# Patient Record
Sex: Female | Born: 1970 | Race: Black or African American | Hispanic: No | Marital: Single | Smoking: Current every day smoker
Health system: Southern US, Community
[De-identification: ages and names within clinical notes are randomized; demographics above are authoritative.]

## PROBLEM LIST (undated history)

## (undated) DIAGNOSIS — K219 Gastro-esophageal reflux disease without esophagitis: Secondary | ICD-10-CM

## (undated) DIAGNOSIS — I10 Essential (primary) hypertension: Secondary | ICD-10-CM

## (undated) DIAGNOSIS — R0602 Shortness of breath: Secondary | ICD-10-CM

## (undated) DIAGNOSIS — J40 Bronchitis, not specified as acute or chronic: Secondary | ICD-10-CM

## (undated) HISTORY — PX: TUBAL LIGATION: SHX77

## (undated) HISTORY — PX: MENISCUS REPAIR: SHX5179

## (undated) HISTORY — PX: INDUCED ABORTION: SHX677

## (undated) HISTORY — PX: ABLATION: SHX5711

## (undated) HISTORY — PX: DILATION AND CURETTAGE OF UTERUS: SHX78

---

## 2001-11-21 ENCOUNTER — Encounter: Payer: Self-pay | Admitting: *Deleted

## 2001-11-21 ENCOUNTER — Emergency Department (HOSPITAL_COMMUNITY): Admission: EM | Admit: 2001-11-21 | Discharge: 2001-11-21 | Payer: Self-pay | Admitting: *Deleted

## 2002-04-17 ENCOUNTER — Emergency Department (HOSPITAL_COMMUNITY): Admission: EM | Admit: 2002-04-17 | Discharge: 2002-04-17 | Payer: Self-pay | Admitting: *Deleted

## 2003-12-08 ENCOUNTER — Emergency Department (HOSPITAL_COMMUNITY): Admission: EM | Admit: 2003-12-08 | Discharge: 2003-12-08 | Payer: Self-pay | Admitting: Emergency Medicine

## 2004-08-09 ENCOUNTER — Emergency Department (HOSPITAL_COMMUNITY): Admission: EM | Admit: 2004-08-09 | Discharge: 2004-08-09 | Payer: Self-pay | Admitting: Emergency Medicine

## 2004-08-12 ENCOUNTER — Emergency Department (HOSPITAL_COMMUNITY): Admission: EM | Admit: 2004-08-12 | Discharge: 2004-08-12 | Payer: Self-pay | Admitting: Emergency Medicine

## 2004-08-13 ENCOUNTER — Emergency Department (HOSPITAL_COMMUNITY): Admission: EM | Admit: 2004-08-13 | Discharge: 2004-08-13 | Payer: Self-pay | Admitting: Emergency Medicine

## 2004-12-09 ENCOUNTER — Emergency Department (HOSPITAL_COMMUNITY): Admission: EM | Admit: 2004-12-09 | Discharge: 2004-12-09 | Payer: Self-pay | Admitting: Emergency Medicine

## 2004-12-12 ENCOUNTER — Emergency Department (HOSPITAL_COMMUNITY): Admission: EM | Admit: 2004-12-12 | Discharge: 2004-12-12 | Payer: Self-pay | Admitting: Emergency Medicine

## 2004-12-14 ENCOUNTER — Emergency Department (HOSPITAL_COMMUNITY): Admission: EM | Admit: 2004-12-14 | Discharge: 2004-12-14 | Payer: Self-pay | Admitting: Emergency Medicine

## 2005-08-01 ENCOUNTER — Emergency Department (HOSPITAL_COMMUNITY): Admission: EM | Admit: 2005-08-01 | Discharge: 2005-08-01 | Payer: Self-pay | Admitting: Emergency Medicine

## 2006-11-03 ENCOUNTER — Emergency Department (HOSPITAL_COMMUNITY): Admission: EM | Admit: 2006-11-03 | Discharge: 2006-11-03 | Payer: Self-pay | Admitting: *Deleted

## 2006-12-18 ENCOUNTER — Emergency Department (HOSPITAL_COMMUNITY): Admission: EM | Admit: 2006-12-18 | Discharge: 2006-12-18 | Payer: Self-pay | Admitting: Emergency Medicine

## 2007-11-19 ENCOUNTER — Emergency Department (HOSPITAL_COMMUNITY): Admission: EM | Admit: 2007-11-19 | Discharge: 2007-11-19 | Payer: Self-pay | Admitting: Emergency Medicine

## 2008-04-04 ENCOUNTER — Emergency Department (HOSPITAL_COMMUNITY): Admission: EM | Admit: 2008-04-04 | Discharge: 2008-04-04 | Payer: Self-pay | Admitting: Emergency Medicine

## 2009-01-02 ENCOUNTER — Emergency Department (HOSPITAL_COMMUNITY): Admission: EM | Admit: 2009-01-02 | Discharge: 2009-01-02 | Payer: Self-pay | Admitting: Emergency Medicine

## 2010-03-21 ENCOUNTER — Emergency Department (HOSPITAL_COMMUNITY)
Admission: EM | Admit: 2010-03-21 | Discharge: 2010-03-21 | Payer: Self-pay | Source: Home / Self Care | Admitting: Emergency Medicine

## 2010-05-30 LAB — URINALYSIS, ROUTINE W REFLEX MICROSCOPIC
Bilirubin Urine: NEGATIVE
Glucose, UA: NEGATIVE mg/dL
Protein, ur: NEGATIVE mg/dL
Urobilinogen, UA: 2 mg/dL — ABNORMAL HIGH (ref 0.0–1.0)

## 2010-05-30 LAB — CBC
HCT: 37.7 % (ref 36.0–46.0)
MCV: 85.7 fL (ref 78.0–100.0)
Platelets: 227 10*3/uL (ref 150–400)
RBC: 4.39 MIL/uL (ref 3.87–5.11)
WBC: 5.5 10*3/uL (ref 4.0–10.5)

## 2010-05-30 LAB — COMPREHENSIVE METABOLIC PANEL
AST: 15 U/L (ref 0–37)
Albumin: 3.6 g/dL (ref 3.5–5.2)
Alkaline Phosphatase: 63 U/L (ref 39–117)
CO2: 26 mEq/L (ref 19–32)
Chloride: 108 mEq/L (ref 96–112)
GFR calc Af Amer: >60 mL/min (ref >60)
GFR calc non Af Amer: >60 mL/min (ref >60)
Potassium: 3.8 mEq/L (ref 3.5–5.1)
Total Bilirubin: 0.5 mg/dL (ref 0.3–1.2)

## 2010-05-30 LAB — DIFFERENTIAL
Basophils Absolute: 0 10*3/uL (ref 0.0–0.1)
Basophils Relative: 1 % (ref 0–1)
Eosinophils Absolute: 0.3 10*3/uL (ref 0.0–0.7)
Eosinophils Relative: 6 % — ABNORMAL HIGH (ref 0–5)
Monocytes Absolute: 0.6 10*3/uL (ref 0.1–1.0)

## 2010-05-30 LAB — D-DIMER, QUANTITATIVE: D-Dimer, Quant: 0.36 ug/mL-FEU (ref 0.00–0.48)

## 2010-05-30 LAB — URINE MICROSCOPIC-ADD ON

## 2010-06-06 ENCOUNTER — Ambulatory Visit: Payer: Self-pay | Admitting: Gastroenterology

## 2010-06-12 LAB — POCT I-STAT, CHEM 8
BUN: 10 mg/dL (ref 6–23)
Chloride: 107 mEq/L (ref 96–112)
Creatinine, Ser: 1 mg/dL (ref 0.4–1.2)
Glucose, Bld: 99 mg/dL (ref 70–99)
HCT: 44 % (ref 36.0–46.0)
Potassium: 4.1 mEq/L (ref 3.5–5.1)

## 2010-07-04 ENCOUNTER — Ambulatory Visit: Payer: Self-pay | Admitting: Urgent Care

## 2010-07-13 NOTE — Consult Note (Signed)
Elizabeth Quinn, Elizabeth Quinn               ACCOUNT NO.:  [id number]   MEDICAL RECORD NO.:  [id number]          PATIENT TYPE:  EMS   LOCATION:  ED                            FACILITY:  [facility]   PHYSICIAN:  J. Darreld Mclean, M.D. DATE OF BIRTH:  1970/11/03   DATE OF CONSULTATION:  08/01/2005  DATE OF DISCHARGE:                                   CONSULTATION   REQUESTING PHYSICIAN:  Dr. Rosalia Hammers   Dr. Rosalia Hammers called me early this morning, stated that the patient had fallen  through a plate glass window.  She had significant lacerations of the  posterior left upper arm, triceps, and significant bleeding.  Patient had  laceration distal to the elbow that Dr. Rosalia Hammers had repaired.  She asked me to  come and assist with closure of the wounds.  Dr. Rosalia Hammers had already  anesthetized the wound with 1% Xylocaine with epinephrine.  The patient was  somewhat somnolent.  She had received fentanyl and Versed.  She had a  laceration on the posterior upper arm with large deep laceration.  Lacerations about 10 cm to 15 cm, deep, multiple layers.  The patient was  already positioned.  Arm was in place.  Using sterile drapes area was  draped.  Used Betadine then did a layered closure.  There was multiple  layers.  Additionally I used a 3-0 Vicryl deeper muscle layers, the fascial  layers, then a 2-0 chromic, then 2-0 plain, then skin staples.  This took  some time to do.  __________  repair.  Sterile dressing applied at the end  of the procedure.  Prescription given for Levaquin 500 antibiotics and  Vicodin ES.  Will see the patient in my office on Friday morning for wound  change and dressing changeout.   IMPRESSION:  Deep laceration by multiple layer closure left upper arm  through the triceps muscle tendon.   Precautions were given.  Patient information booklet given, antibiotics, as  stated.  Come back to the emergency room if any problem.  Will go talk to  the family now.           ______________________________  J.  Darreld Mclean, M.D.     JWK/MEDQ  D:  08/01/2005  T:  08/01/2005  Job:  [id number]

## 2010-10-06 ENCOUNTER — Emergency Department (HOSPITAL_COMMUNITY)
Admission: EM | Admit: 2010-10-06 | Discharge: 2010-10-06 | Disposition: A | Discharge status: Home / Self Care | Payer: Medicaid Other | Attending: Emergency Medicine | Admitting: Emergency Medicine

## 2010-10-06 DIAGNOSIS — R109 Unspecified abdominal pain: Secondary | ICD-10-CM | POA: Insufficient documentation

## 2010-10-06 HISTORY — DX: Gastro-esophageal reflux disease without esophagitis: K21.9

## 2010-10-06 LAB — URINALYSIS, ROUTINE W REFLEX MICROSCOPIC
Glucose, UA: NEGATIVE mg/dL
Ketones, ur: NEGATIVE mg/dL
Leukocytes, UA: NEGATIVE
Protein, ur: NEGATIVE mg/dL
Urobilinogen, UA: 1 mg/dL (ref 0.0–1.0)

## 2010-10-06 LAB — WET PREP, GENITAL
Trich, Wet Prep: NONE SEEN
Yeast Wet Prep HPF POC: NONE SEEN

## 2010-10-06 LAB — URINE MICROSCOPIC-ADD ON

## 2010-10-06 MED ORDER — NAPROXEN 500 MG PO TABS: 500.0000 mg | ORAL_TABLET | Freq: Two times a day (BID) | ORAL | Status: DC

## 2010-10-06 MED ORDER — KETOROLAC TROMETHAMINE 60 MG/2ML IM SOLN
60.0000 mg | Freq: Once | INTRAMUSCULAR | Status: AC
  Administered 2010-10-06: 60 mg via INTRAMUSCULAR
  Filled 2010-10-06 (×2): qty 2

## 2010-10-06 NOTE — ED Notes (Signed)
Pt reports started menstrual period last Saturday and usually last 7 days.  PT says by now period is usually very light but is still very heavy.  Pt c/o abd cramps and headache.  Denies any other symptoms.  Says has had tubal ligation.

## 2010-10-06 NOTE — ED Provider Notes (Signed)
History     CSN: [id number] Arrival date & time: 10/06/2010  3:57 PM  Chief Complaint  Patient presents with  . Abdominal Pain   HPI Comments: Patient is a 40 year old female who presents with a complaint of abdominal cramping and heavy vaginal bleeding. She states that she's had vaginal bleeding going on approximately 7 days. She states this is longer than she usually bleeds. She is very regular cycle and she has not missed any periods. Symptoms are constant, mild to moderate, crampy in nature, not associated with fever, chills, nausea, vomiting, dysuria, diarrhea. She has had no medications prior to arrival.  Patient is a 40 y.o. female presenting with abdominal pain. The history is provided by the patient.  Abdominal Pain The primary symptoms of the illness include abdominal pain.    Past Medical History  Diagnosis Date  . Acid reflux     Past Surgical History  Procedure Date  . Tubal ligation   . Induced abortion   . Dilation and curettage of uterus     History reviewed. No pertinent family history.  History  Substance Use Topics  . Smoking status: Current Everyday Smoker  . Smokeless tobacco: Not on file  . Alcohol Use: Yes    OB History    Grav Para Term Preterm Abortions TAB SAB Ect Mult Living   5         3      Review of Systems  Gastrointestinal: Positive for abdominal pain.  All other systems reviewed and are negative.    Physical Exam  BP 124/92  Pulse 79  Temp(Src) 98.1 F (36.7 C) (Oral)  Resp 17  Ht 5\' 9"  (1.753 m)  Wt 145 lb (65.772 kg)  BMI 21.41 kg/m2  SpO2 100%  LMP 09/29/2010  Physical Exam  Nursing note and vitals reviewed. Constitutional: She appears well-developed and well-nourished. No distress.  HENT:  Head: Normocephalic and atraumatic.  Mouth/Throat: Oropharynx is clear and moist. No oropharyngeal exudate.  Eyes: Conjunctivae and EOM are normal. Pupils are equal, round, and reactive to light. Right eye exhibits no discharge.  Left eye exhibits no discharge. No scleral icterus.  Neck: Normal range of motion. Neck supple. No JVD present. No thyromegaly present.  Cardiovascular: Normal rate, regular rhythm, normal heart sounds and intact distal pulses.  Exam reveals no gallop and no friction rub.   No murmur heard. Pulmonary/Chest: Effort normal and breath sounds normal. No respiratory distress. She has no wheezes. She has no rales.  Abdominal: Soft. Bowel sounds are normal. She exhibits no distension and no mass. There is tenderness (mild suprapubic and bilateral lower abdominal tenderness to palpation).       No upper abdominal or midabdominal tenderness to palpation, no peritoneal, normal bowel sounds.  Genitourinary:       Chaperone present for exam.  No adnexal or cervical motion tenderness. No vaginal discharge, mild vaginal bleeding, no adnexal masses.  Musculoskeletal: Normal range of motion. She exhibits no edema and no tenderness.  Lymphadenopathy:    She has no cervical adenopathy.  Neurological: She is alert. Coordination normal.  Skin: Skin is warm and dry. No rash noted. No erythema.  Psychiatric: She has a normal mood and affect. Her behavior is normal.    ED Course  Procedures  MDM Abnormal vaginal bleeding, history of sterilization procedure, will check for pregnancy despite this as patient states is sexually active. She reports that she was told that she needed to endometrial ablation about 5 years ago  but did not followup for this. Will refer back to her OB/GYN for further care. Intramuscular Toradol given in the emergency department   Results for orders placed during the hospital encounter of 10/06/10  WET PREP, GENITAL      Component Value Range   Yeast, Wet Prep NONE SEEN  NONE SEEN    Trich, Wet Prep NONE SEEN  NONE SEEN    Clue Cells, Wet Prep FEW (*) NONE SEEN    WBC, Wet Prep HPF POC FEW (*) NONE SEEN   URINALYSIS, ROUTINE W REFLEX MICROSCOPIC      Component Value Range   Color,  Urine YELLOW  YELLOW    Appearance CLEAR  CLEAR    Specific Gravity, Urine >1.030 (*) 1.005 - 1.030    pH 5.5  5.0 - 8.0    Glucose, UA NEGATIVE  NEGATIVE (mg/dL)   Hgb urine dipstick TRACE (*) NEGATIVE    Bilirubin Urine NEGATIVE  NEGATIVE    Ketones, ur NEGATIVE  NEGATIVE (mg/dL)   Protein, ur NEGATIVE  NEGATIVE (mg/dL)   Urobilinogen, UA 1.0  0.0 - 1.0 (mg/dL)   Nitrite NEGATIVE  NEGATIVE    Leukocytes, UA NEGATIVE  NEGATIVE   PREGNANCY, URINE      Component Value Range   Preg Test, Ur NEGATIVE    URINE MICROSCOPIC-ADD ON      Component Value Range   Squamous Epithelial / LPF RARE  RARE    WBC, UA 0-2  <3 (WBC/hpf)   RBC / HPF 0-2  <3 (RBC/hpf)   Patient's urinalysis reveals dehydration but no signs of infection and pregnancy is negative. We'll discharge her with followup with OB/GYN    Vida Roller, MD 10/06/10 1701

## 2010-10-06 NOTE — ED Notes (Signed)
Reports started menstrual cycle 7 days ago; reports that usually bleeding is light by this point in her cycle, but c/o heavy bleeding now with lower abd cramping.

## 2010-10-24 ENCOUNTER — Emergency Department (HOSPITAL_COMMUNITY)
Admission: EM | Admit: 2010-10-24 | Discharge: 2010-10-25 | Disposition: A | Discharge status: Home / Self Care | Payer: Medicaid Other | Attending: Emergency Medicine | Admitting: Emergency Medicine

## 2010-10-24 ENCOUNTER — Encounter (HOSPITAL_COMMUNITY): Payer: Self-pay | Admitting: *Deleted

## 2010-10-24 DIAGNOSIS — F17200 Nicotine dependence, unspecified, uncomplicated: Secondary | ICD-10-CM | POA: Insufficient documentation

## 2010-10-24 DIAGNOSIS — N12 Tubulo-interstitial nephritis, not specified as acute or chronic: Secondary | ICD-10-CM | POA: Insufficient documentation

## 2010-10-24 LAB — CBC
MCH: 29.5 pg (ref 26.0–34.0)
MCV: 84.3 fL (ref 78.0–100.0)
Platelets: 223 10*3/uL (ref 150–400)
RDW: 13.2 % (ref 11.5–15.5)
WBC: 14.5 10*3/uL — ABNORMAL HIGH (ref 4.0–10.5)

## 2010-10-24 LAB — URINALYSIS, ROUTINE W REFLEX MICROSCOPIC
Bilirubin Urine: NEGATIVE
Nitrite: POSITIVE — AB
Specific Gravity, Urine: 1.025 (ref 1.005–1.030)
pH: 6 (ref 5.0–8.0)

## 2010-10-24 LAB — URINE MICROSCOPIC-ADD ON

## 2010-10-24 LAB — BASIC METABOLIC PANEL
Calcium: 9.2 mg/dL (ref 8.4–10.5)
Creatinine, Ser: 0.79 mg/dL (ref 0.50–1.10)
GFR calc Af Amer: >60 mL/min (ref >60)
GFR calc non Af Amer: >60 mL/min (ref >60)

## 2010-10-24 MED ORDER — HYDROMORPHONE HCL 1 MG/ML IJ SOLN
1.0000 mg | Freq: Once | INTRAMUSCULAR | Status: AC
  Administered 2010-10-24: 1 mg via INTRAVENOUS
  Filled 2010-10-24: qty 1

## 2010-10-24 MED ORDER — SODIUM CHLORIDE 0.9 % IV BOLUS (SEPSIS): 250.0000 mL | Freq: Once | INTRAVENOUS | Status: DC

## 2010-10-24 MED ORDER — DEXTROSE 5 % IV SOLN
1.0000 g | Freq: Once | INTRAVENOUS | Status: AC
  Administered 2010-10-24: 1 g via INTRAVENOUS
  Filled 2010-10-24: qty 1

## 2010-10-24 MED ORDER — SODIUM CHLORIDE 0.9 % IV SOLN: INTRAVENOUS | Status: DC

## 2010-10-24 MED ORDER — ONDANSETRON HCL 4 MG/2ML IJ SOLN
4.0000 mg | Freq: Once | INTRAMUSCULAR | Status: AC
  Administered 2010-10-24: 4 mg via INTRAVENOUS
  Filled 2010-10-24: qty 2

## 2010-10-24 NOTE — ED Provider Notes (Signed)
History     CSN: [id number] Arrival date & time: 10/24/2010  8:58 PM Scribed for Shelda Jakes, MD, the patient was seen in room [facility]. This chart was scribed by Katha Cabal. This patient's care was started at 9:53PM.     Chief Complaint  Patient presents with  . Back Pain  . Abdominal Pain  . Chills   HPI  Elizabeth Quinn is a 40 y.o. female who presents to the Emergency Department complaining of RUQ, RLQ, LUQ, LLQ abdominal pain that began 18 days ago and has been gradually getting worse.  Associated sx include bilateral lower extremity pain and cold perception.  Denies N/V/D, chest pain, rash, and bowel incontinence.  Pt was previously seen in ED 10/06/10 and was Dx with menstral cramps and given a referral to Dr. Emelda Fear.  Pt saw Dr. Emelda Fear 2 days ago for pain and upon evaluation causee of pain not identified.   Pt has an appointment with Dr. Emelda Fear for a Endometrial Abalition.  Pt had similar sx 5 years ago and began again after last period.  LMP 10/06/10   PAST MEDICAL HISTORY:  Past Medical History  Diagnosis Date  . Acid reflux     PAST SURGICAL HISTORY:  Past Surgical History  Procedure Date  . Tubal ligation   . Induced abortion   . Dilation and curettage of uterus     MEDICATIONS:  Previous Medications   NAPROXEN (NAPROSYN) 500 MG TABLET    Take 1 tablet (500 mg total) by mouth 2 (two) times daily.     ALLERGIES:  Allergies as of 10/24/2010  . (No Known Allergies)     FAMILY HISTORY:  History reviewed. No pertinent family history.   SOCIAL HISTORY: History   Social History  . Marital Status: Single    Spouse Name: N/A    Number of Children: N/A  . Years of Education: N/A   Social History Main Topics  . Smoking status: Current Everyday Smoker  . Smokeless tobacco: None  . Alcohol Use: Yes  . Drug Use: No  . Sexually Active: Yes    Birth Control/ Protection: Condom   Other Topics Concern  . None   Social History Narrative  .  None     Review of Systems 10 Systems reviewed and are negative for acute change except as noted in the HPI.  Physical Exam  BP 122/79  Pulse 108  Temp(Src) 100.5 F (38.1 C) (Oral)  Resp 20  Ht 5\' 9"  (1.753 m)  Wt 145 lb (65.772 kg)  BMI 21.41 kg/m2  SpO2 99%  LMP 09/29/2010  Physical Exam  Nursing note and vitals reviewed. Constitutional: She is oriented to person, place, and time. She appears well-developed and well-nourished.  HENT:  Head: Normocephalic and atraumatic.  Mouth/Throat: Oropharynx is clear and moist.  Eyes: EOM are normal. Pupils are equal, round, and reactive to light.  Neck: Normal range of motion. Neck supple.  Cardiovascular: Normal rate, regular rhythm and normal heart sounds.  Exam reveals no gallop and no friction rub.   No murmur heard. Pulmonary/Chest: Effort normal and breath sounds normal. She has no wheezes.  Abdominal: Soft. Bowel sounds are normal. There is no tenderness. There is no rebound and no guarding.  Musculoskeletal: Normal range of motion. She exhibits no edema.       No lower back tenderness.    Neurological: She is alert and oriented to person, place, and time. No cranial nerve deficit or sensory deficit.  Skin: Skin is warm and dry.  Psychiatric: She has a normal mood and affect. Her behavior is normal.    ED Course  Procedures  OTHER DATA REVIEWED: Nursing notes, vital signs, and past medical records reviewed.    DIAGNOSTIC STUDIES: Oxygen Saturation is 99% on room air, nromal by my interpretation.      LABS / RADIOLOGY:  Results for orders placed during the hospital encounter of 10/24/10  URINALYSIS, ROUTINE W REFLEX MICROSCOPIC      Component Value Range   Color, Urine ORANGE (*) YELLOW    Appearance CLOUDY (*) CLEAR    Specific Gravity, Urine 1.025  1.005 - 1.030    pH 6.0  5.0 - 8.0    Glucose, UA NEGATIVE  NEGATIVE (mg/dL)   Hgb urine dipstick MODERATE (*) NEGATIVE    Bilirubin Urine NEGATIVE  NEGATIVE     Ketones, ur NEGATIVE  NEGATIVE (mg/dL)   Protein, ur 30 (*) NEGATIVE (mg/dL)   Urobilinogen, UA 2.0 (*) 0.0 - 1.0 (mg/dL)   Nitrite POSITIVE (*) NEGATIVE    Leukocytes, UA MODERATE (*) NEGATIVE   PREGNANCY, URINE      Component Value Range   Preg Test, Ur NEGATIVE    URINE MICROSCOPIC-ADD ON      Component Value Range   Squamous Epithelial / LPF RARE  RARE    WBC, UA 21-50  <3 (WBC/hpf)   RBC / HPF 11-20  <3 (RBC/hpf)   Bacteria, UA MANY (*) RARE   CBC      Component Value Range   WBC 14.5 (*) 4.0 - 10.5 (K/uL)   RBC 4.27  3.87 - 5.11 (MIL/uL)   Hemoglobin 12.6  12.0 - 15.0 (g/dL)   HCT 04.5  40.9 - 81.1 (%)   MCV 84.3  78.0 - 100.0 (fL)   MCH 29.5  26.0 - 34.0 (pg)   MCHC 35.0  30.0 - 36.0 (g/dL)   RDW 91.4  78.2 - 95.6 (%)   Platelets 223  150 - 400 (K/uL)  BASIC METABOLIC PANEL      Component Value Range   Sodium 133 (*) 135 - 145 (mEq/L)   Potassium 3.5  3.5 - 5.1 (mEq/L)   Chloride 100  96 - 112 (mEq/L)   CO2 23  19 - 32 (mEq/L)   Glucose, Bld 106 (*) 70 - 99 (mg/dL)   BUN 6  6 - 23 (mg/dL)   Creatinine, Ser 2.13  0.50 - 1.10 (mg/dL)   Calcium 9.2  8.4 - 08.6 (mg/dL)   GFR calc non Af Amer >60  >60 (mL/min)   GFR calc Af Amer >60  >60 (mL/min)     No results found.     ED COURSE / COORDINATION OF CARE: 9:57 PM    PE complete.Will order antiemitc and CT Abdomen.  12:15 AM Urine indicated infection. Patient was given IV fluids, pain control, and IV ROCEPHIN.  Pt states she feels much better now.  Discussed that she didn't have a urinary infection 2 weeks ago.  Patient is to return if sx worsen or no improvement noted with treatment.  Plan to discharge patient.   Patient agrees with plan.     Orders Placed This Encounter  Procedures  . Urine culture  . Urinalysis with microscopic  . Pregnancy, urine  . Urine microscopic-add on  . CBC  . Basic metabolic panel    MDM:   CW UTI PYELONEPHRITIS IMPROVED IN ED WITH IV ROCEPHIN. SUSPECT THIS A NEW PROBLEM  AND  NOT ONGOING FOR 2 WEEKS. IF NOT BETTER IN 2 DAYS AND URINE CLEARS WILL NEED CT ABD PELVIS TO FURTHER EVAL.     IMPRESSION: Diagnoses that have been ruled out:  Diagnoses that are still under consideration:  Final diagnoses:  Pyelonephritis, unspecified    PLAN:  Home Advised to return for worsening or additional problems such as abdominal or chest pain The patient is to return the emergency department if there is any worsening of symptoms. I have reviewed the discharge instructions with the patient.     CONDITION ON DISCHARGE: Good   MEDICATIONS GIVEN IN THE E.D. Scheduled Meds:    . HYDROmorphone  1 mg Intravenous Once  . ondansetron  4 mg Intravenous Once  . ondansetron  4 mg Intravenous Once  . sodium chloride  250 mL Intravenous Once   Continuous Infusions:    . sodium chloride    . cefTRIAXone (ROCEPHIN) IVPB 1 gram/50 mL D5W 1 g (10/24/10 2244)      DISCHARGE MEDICATIONS: New Prescriptions   CEPHALEXIN (KEFLEX) 500 MG CAPSULE    Take 1 capsule (500 mg total) by mouth 4 (four) times daily.   HYDROCODONE-ACETAMINOPHEN (NORCO) 5-325 MG PER TABLET    Take 1-2 tablets by mouth every 4 (four) hours as needed for pain.   PROMETHAZINE (PHENERGAN) 25 MG TABLET    Take 1 tablet (25 mg total) by mouth every 6 (six) hours as needed for nausea.     I personally performed the services described in this documentation, which was scribed in my presence. The recorded information has been reviewed and considered. Shelda Jakes, MD        Shelda Jakes, MD 10/25/10 [phone]

## 2010-10-24 NOTE — ED Notes (Signed)
Pt c/o abd pain and lower back pain x18 days; pt states she has been having chills as well

## 2010-10-25 MED ORDER — PROMETHAZINE HCL 25 MG PO TABS: 25.0000 mg | ORAL_TABLET | Freq: Four times a day (QID) | ORAL | Status: AC | PRN

## 2010-10-25 MED ORDER — HYDROCODONE-ACETAMINOPHEN 5-325 MG PO TABS: 1.0000 | ORAL_TABLET | ORAL | Status: AC | PRN

## 2010-10-25 MED ORDER — CEPHALEXIN 500 MG PO CAPS: 500.0000 mg | ORAL_CAPSULE | Freq: Four times a day (QID) | ORAL | Status: AC

## 2010-10-25 MED ORDER — ONDANSETRON HCL 4 MG/2ML IJ SOLN
4.0000 mg | Freq: Once | INTRAMUSCULAR | Status: AC
  Administered 2010-10-25: 4 mg via INTRAVENOUS
  Filled 2010-10-25: qty 2

## 2010-10-27 LAB — URINE CULTURE

## 2010-10-28 NOTE — ED Notes (Signed)
+   urine culture. Treated with Keflex, sensitive to same per protocol MD. 

## 2010-11-22 ENCOUNTER — Encounter (HOSPITAL_COMMUNITY): Payer: Self-pay

## 2010-11-22 DIAGNOSIS — T7411XA Adult physical abuse, confirmed, initial encounter: Secondary | ICD-10-CM | POA: Insufficient documentation

## 2010-11-22 DIAGNOSIS — S01501A Unspecified open wound of lip, initial encounter: Secondary | ICD-10-CM | POA: Insufficient documentation

## 2010-11-22 DIAGNOSIS — IMO0002 Reserved for concepts with insufficient information to code with codable children: Secondary | ICD-10-CM | POA: Insufficient documentation

## 2010-11-22 DIAGNOSIS — F17200 Nicotine dependence, unspecified, uncomplicated: Secondary | ICD-10-CM | POA: Insufficient documentation
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
## 2010-11-23 ENCOUNTER — Emergency Department (HOSPITAL_COMMUNITY)
Admission: EM | Admit: 2010-11-23 | Discharge: 2010-11-23 | Disposition: A | Discharge status: Home / Self Care | Payer: Medicaid Other | Attending: Emergency Medicine | Admitting: Emergency Medicine

## 2010-11-23 DIAGNOSIS — S01511A Laceration without foreign body of lip, initial encounter: Secondary | ICD-10-CM

## 2010-11-23 DIAGNOSIS — T07XXXA Unspecified multiple injuries, initial encounter: Secondary | ICD-10-CM

## 2010-11-23 MED ORDER — HYDROCODONE-ACETAMINOPHEN 5-500 MG PO TABS: 1.0000 | ORAL_TABLET | Freq: Four times a day (QID) | ORAL | Status: AC | PRN

## 2010-11-23 MED ORDER — BACITRACIN ZINC 500 UNIT/GM EX OINT
TOPICAL_OINTMENT | Freq: Once | CUTANEOUS | Status: AC
  Administered 2010-11-23: 02:00:00 via TOPICAL
  Filled 2010-11-23: qty 0.9

## 2010-11-23 NOTE — ED Notes (Signed)
Abrasion noted to mouth and left  knees and left shoulder
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
## 2010-11-23 NOTE — ED Notes (Signed)
Pt self ambulated out with a steady gait stating no needs 

## 2010-11-28 ENCOUNTER — Encounter (HOSPITAL_COMMUNITY)
Admission: RE | Admit: 2010-11-28 | Discharge: 2010-11-28 | Disposition: A | Discharge status: Home / Self Care | Payer: Medicaid Other | Source: Ambulatory Visit | Attending: Obstetrics & Gynecology | Admitting: Obstetrics & Gynecology

## 2010-11-28 ENCOUNTER — Other Ambulatory Visit: Payer: Self-pay | Admitting: Obstetrics & Gynecology

## 2010-11-28 ENCOUNTER — Encounter (HOSPITAL_COMMUNITY): Payer: Self-pay

## 2010-11-28 HISTORY — DX: Shortness of breath: R06.02

## 2010-11-28 LAB — CBC
Hemoglobin: 13.2 g/dL (ref 12.0–15.0)
MCH: 28.5 pg (ref 26.0–34.0)
MCHC: 34.2 g/dL (ref 30.0–36.0)
Platelets: 253 10*3/uL (ref 150–400)
RBC: 4.63 MIL/uL (ref 3.87–5.11)

## 2010-11-28 LAB — COMPREHENSIVE METABOLIC PANEL
ALT: 8 U/L (ref 0–35)
AST: 11 U/L (ref 0–37)
Albumin: 3.7 g/dL (ref 3.5–5.2)
Alkaline Phosphatase: 62 U/L (ref 39–117)
Calcium: 9.8 mg/dL (ref 8.4–10.5)
Potassium: 4 mEq/L (ref 3.5–5.1)
Sodium: 138 mEq/L (ref 135–145)
Total Protein: 7.2 g/dL (ref 6.0–8.3)

## 2010-11-28 LAB — SURGICAL PCR SCREEN
MRSA, PCR: NEGATIVE
Staphylococcus aureus: NEGATIVE

## 2010-11-28 LAB — URINALYSIS, ROUTINE W REFLEX MICROSCOPIC
Bilirubin Urine: NEGATIVE
Glucose, UA: NEGATIVE mg/dL
Hgb urine dipstick: NEGATIVE
Specific Gravity, Urine: <1.005 — ABNORMAL LOW (ref 1.005–1.030)
Urobilinogen, UA: 0.2 mg/dL (ref 0.0–1.0)
pH: 5.5 (ref 5.0–8.0)

## 2010-11-28 NOTE — Patient Instructions (Addendum)
20 WREATHA STURGEON  11/28/2010   Your procedure is scheduled on:  12/05/2010  Report to [facility] at  900  AM.  Call this number if you have problems the morning of surgery: [phone]   Remember:   Do not eat food:After Midnight.  Do not drink clear liquids: After Midnight.  Take these medicines the morning of surgery with A SIP OF WATER: none   Do not wear jewelry, make-up or nail polish.  Do not wear lotions, powders, or perfumes. You may wear deodorant.  Do not shave 48 hours prior to surgery.  Do not bring valuables to the hospital.  Contacts, dentures or bridgework may not be worn into surgery.  Leave suitcase in the car. After surgery it may be brought to your room.  For patients admitted to the hospital, checkout time is 11:00 AM the day of discharge.   Patients discharged the day of surgery will not be allowed to drive home.  Name and phone number of your driver: family  Special Instructions: CHG Shower Use Special Wash: 1/2 bottle night before surgery and 1/2 bottle morning of surgery.   Please read over the following fact sheets that you were given: Pain Booklet, MRSA Information, Surgical Site Infection Prevention, Anesthesia Post-op Instructions and Care and Recovery After Surgery PATIENT INSTRUCTIONS POST-ANESTHESIA  IMMEDIATELY FOLLOWING SURGERY:  Do not drive or operate machinery for the first twenty four hours after surgery.  Do not make any important decisions for twenty four hours after surgery or while taking narcotic pain medications or sedatives.  If you develop intractable nausea and vomiting or a severe headache please notify your doctor immediately.  FOLLOW-UP:  Please make an appointment with your surgeon as instructed. You do not need to follow up with anesthesia unless specifically instructed to do so.  WOUND CARE INSTRUCTIONS (if applicable):  Keep a dry clean dressing on the anesthesia/puncture wound site if there is drainage.  Once the wound has quit  draining you may leave it open to air.  Generally you should leave the bandage intact for twenty four hours unless there is drainage.  If the epidural site drains for more than 36-48 hours please call the anesthesia department.  QUESTIONS?:  Please feel free to call your physician or the hospital operator if you have any questions, and they will be happy to assist you.     [facility] Anesthesia Department [address] [place] [phone]

## 2010-12-05 ENCOUNTER — Encounter (HOSPITAL_COMMUNITY)
Admission: RE | Disposition: A | Discharge status: Home / Self Care | Payer: Self-pay | Source: Ambulatory Visit | Attending: Obstetrics & Gynecology

## 2010-12-05 ENCOUNTER — Encounter (HOSPITAL_COMMUNITY): Payer: Self-pay | Admitting: Anesthesiology

## 2010-12-05 ENCOUNTER — Encounter (HOSPITAL_COMMUNITY): Payer: Self-pay | Admitting: *Deleted

## 2010-12-05 ENCOUNTER — Ambulatory Visit (HOSPITAL_COMMUNITY)
Admission: RE | Admit: 2010-12-05 | Discharge: 2010-12-05 | Disposition: A | Discharge status: Home / Self Care | Payer: Medicaid Other | Source: Ambulatory Visit | Attending: Obstetrics & Gynecology | Admitting: Obstetrics & Gynecology

## 2010-12-05 ENCOUNTER — Ambulatory Visit (HOSPITAL_COMMUNITY): Payer: Medicaid Other | Admitting: Anesthesiology

## 2010-12-05 DIAGNOSIS — Z01812 Encounter for preprocedural laboratory examination: Secondary | ICD-10-CM | POA: Insufficient documentation

## 2010-12-05 DIAGNOSIS — N920 Excessive and frequent menstruation with regular cycle: Secondary | ICD-10-CM | POA: Insufficient documentation

## 2010-12-05 DIAGNOSIS — N946 Dysmenorrhea, unspecified: Secondary | ICD-10-CM | POA: Insufficient documentation

## 2010-12-05 DIAGNOSIS — Z9889 Other specified postprocedural states: Secondary | ICD-10-CM

## 2010-12-05 SURGERY — DILATATION & CURETTAGE/HYSTEROSCOPY WITH THERMACHOICE ABLATION
Anesthesia: General | Site: Uterus | Wound class: Clean Contaminated

## 2010-12-05 MED ORDER — LACTATED RINGERS IV SOLN
INTRAVENOUS | Status: DC
  Administered 2010-12-05: 11:00:00 via INTRAVENOUS

## 2010-12-05 MED ORDER — ONDANSETRON HCL 4 MG/2ML IJ SOLN
4.0000 mg | Freq: Once | INTRAMUSCULAR | Status: AC
  Administered 2010-12-05: 4 mg via INTRAVENOUS

## 2010-12-05 MED ORDER — FENTANYL CITRATE 0.05 MG/ML IJ SOLN
INTRAMUSCULAR | Status: DC | PRN
  Administered 2010-12-05 (×2): 50 ug via INTRAVENOUS

## 2010-12-05 MED ORDER — PROPOFOL 10 MG/ML IV EMUL
INTRAVENOUS | Status: AC
  Filled 2010-12-05: qty 20

## 2010-12-05 MED ORDER — MIDAZOLAM HCL 2 MG/2ML IJ SOLN
INTRAMUSCULAR | Status: AC
  Administered 2010-12-05: 2 mg via INTRAVENOUS
  Filled 2010-12-05: qty 2

## 2010-12-05 MED ORDER — ONDANSETRON HCL 8 MG PO TABS: 8.0000 mg | ORAL_TABLET | Freq: Three times a day (TID) | ORAL | Status: AC | PRN

## 2010-12-05 MED ORDER — MIDAZOLAM HCL 2 MG/2ML IJ SOLN
1.0000 mg | INTRAMUSCULAR | Status: DC | PRN
  Administered 2010-12-05 (×2): 2 mg via INTRAVENOUS

## 2010-12-05 MED ORDER — SODIUM CHLORIDE 0.9 % IR SOLN
Status: DC | PRN
  Administered 2010-12-05: 3000 mL

## 2010-12-05 MED ORDER — FENTANYL CITRATE 0.05 MG/ML IJ SOLN
25.0000 ug | INTRAMUSCULAR | Status: DC | PRN
  Administered 2010-12-05 (×2): 50 ug via INTRAVENOUS

## 2010-12-05 MED ORDER — LIDOCAINE HCL (PF) 1 % IJ SOLN
INTRAMUSCULAR | Status: AC
  Filled 2010-12-05: qty 5

## 2010-12-05 MED ORDER — PROPOFOL 10 MG/ML IV EMUL
INTRAVENOUS | Status: DC | PRN
  Administered 2010-12-05: 50 mg via INTRAVENOUS
  Administered 2010-12-05: 150 mg via INTRAVENOUS

## 2010-12-05 MED ORDER — CEFAZOLIN SODIUM 1-5 GM-% IV SOLN
1.0000 g | INTRAVENOUS | Status: AC
  Administered 2010-12-05: 1 g via INTRAVENOUS

## 2010-12-05 MED ORDER — ONDANSETRON HCL 4 MG/2ML IJ SOLN
INTRAMUSCULAR | Status: AC
  Administered 2010-12-05: 4 mg via INTRAVENOUS
  Filled 2010-12-05: qty 2

## 2010-12-05 MED ORDER — MIDAZOLAM HCL 2 MG/2ML IJ SOLN
INTRAMUSCULAR | Status: AC
  Filled 2010-12-05: qty 2

## 2010-12-05 MED ORDER — FENTANYL CITRATE 0.05 MG/ML IJ SOLN
INTRAMUSCULAR | Status: AC
  Administered 2010-12-05: 50 ug via INTRAVENOUS
  Filled 2010-12-05: qty 2

## 2010-12-05 MED ORDER — KETOROLAC TROMETHAMINE 30 MG/ML IJ SOLN
INTRAMUSCULAR | Status: AC
  Administered 2010-12-05: 30 mg via INTRAVENOUS
  Filled 2010-12-05: qty 1

## 2010-12-05 MED ORDER — HYDROCODONE-ACETAMINOPHEN 5-500 MG PO TABS: 1.0000 | ORAL_TABLET | Freq: Four times a day (QID) | ORAL | Status: AC | PRN

## 2010-12-05 MED ORDER — CEFAZOLIN SODIUM 1-5 GM-% IV SOLN
INTRAVENOUS | Status: AC
  Filled 2010-12-05: qty 50

## 2010-12-05 MED ORDER — KETOROLAC TROMETHAMINE 30 MG/ML IJ SOLN
30.0000 mg | Freq: Four times a day (QID) | INTRAMUSCULAR | Status: DC | PRN
  Administered 2010-12-05: 30 mg via INTRAVENOUS

## 2010-12-05 MED ORDER — SODIUM CHLORIDE 0.9 % IR SOLN
Status: DC | PRN
  Administered 2010-12-05: 1000 mL

## 2010-12-05 MED ORDER — LIDOCAINE HCL 1 % IJ SOLN
INTRAMUSCULAR | Status: DC | PRN
  Administered 2010-12-05: 20 mg via INTRADERMAL

## 2010-12-05 MED ORDER — KETOROLAC TROMETHAMINE 10 MG PO TABS: 10.0000 mg | ORAL_TABLET | Freq: Three times a day (TID) | ORAL | Status: AC | PRN

## 2010-12-05 MED ORDER — DEXTROSE 5 % IV SOLN
INTRAVENOUS | Status: DC | PRN
  Administered 2010-12-05: 500 mL via INTRAVENOUS

## 2010-12-05 MED ORDER — ONDANSETRON HCL 4 MG/2ML IJ SOLN: 4.0000 mg | Freq: Once | INTRAMUSCULAR | Status: DC | PRN

## 2010-12-05 SURGICAL SUPPLY — 32 items
BAG HAMPER (MISCELLANEOUS) ×2 IMPLANT
CATH THERMACHOICE III (CATHETERS) ×3 IMPLANT
CLOTH BEACON ORANGE TIMEOUT ST (SAFETY) ×2 IMPLANT
COVER LIGHT HANDLE STERIS (MISCELLANEOUS) ×4 IMPLANT
FORMALIN 10 PREFIL 120ML (MISCELLANEOUS) ×1 IMPLANT
GAUZE SPONGE 4X4 16PLY XRAY LF (GAUZE/BANDAGES/DRESSINGS) ×2 IMPLANT
GLOVE BIOGEL PI IND STRL 7.0 (GLOVE) IMPLANT
GLOVE BIOGEL PI IND STRL 8 (GLOVE) ×1 IMPLANT
GLOVE BIOGEL PI INDICATOR 7.0 (GLOVE) ×2
GLOVE BIOGEL PI INDICATOR 8 (GLOVE) ×1
GLOVE ECLIPSE 8.0 STRL XLNG CF (GLOVE) ×2 IMPLANT
GLOVE EXAM NITRILE MD LF STRL (GLOVE) ×2 IMPLANT
GLOVE OPTIFIT SS 6.5 STRL BRWN (GLOVE) ×1 IMPLANT
GLOVE SS BIOGEL STRL SZ 6.5 (GLOVE) IMPLANT
GLOVE SUPERSENSE BIOGEL SZ 6.5 (GLOVE) ×1
GOWN BRE IMP SLV AUR XL STRL (GOWN DISPOSABLE) ×2 IMPLANT
GOWN STRL REIN XL XLG (GOWN DISPOSABLE) ×2 IMPLANT
INST SET HYSTEROSCOPY (KITS) ×2 IMPLANT
IV NS IRRIG 3000ML ARTHROMATIC (IV SOLUTION) ×2 IMPLANT
KIT ROOM TURNOVER APOR (KITS) ×2 IMPLANT
MANIFOLD NEPTUNE II (INSTRUMENTS) ×2 IMPLANT
MANIFOLD NEPTUNE WASTE (CANNULA) ×2 IMPLANT
MARKER SKIN DUAL TIP RULER LAB (MISCELLANEOUS) ×1 IMPLANT
NS IRRIG 1000ML POUR BTL (IV SOLUTION) ×2 IMPLANT
PACK BASIC III (CUSTOM PROCEDURE TRAY) ×2
PACK SRG BSC III STRL LF ECLPS (CUSTOM PROCEDURE TRAY) ×1 IMPLANT
PAD ARMBOARD 7.5X6 YLW CONV (MISCELLANEOUS) ×2 IMPLANT
PAD TELFA 3X4 1S STER (GAUZE/BANDAGES/DRESSINGS) ×2 IMPLANT
SET IRRIG Y TYPE TUR BLADDER L (SET/KITS/TRAYS/PACK) ×2 IMPLANT
SHEET LAVH (DRAPES) ×3 IMPLANT
SOLUTION ANTI FOG 6CC (MISCELLANEOUS) ×1 IMPLANT
YANKAUER SUCT BULB TIP 10FT TU (MISCELLANEOUS) ×2 IMPLANT

## 2010-12-05 NOTE — Interval H&P Note (Signed)
History and Physical Interval Note:   12/05/2010   8:03 AM   Elizabeth Quinn  has presented today for surgery, with the diagnosis of menometorrhagia dysmenorrhea  The various methods of treatment have been discussed with the patient and family. After consideration of risks, benefits and other options for treatment, the patient has consented to  Procedure(s): DILATATION & CURETTAGE/HYSTEROSCOPY WITH THERMACHOICE ABLATION as a surgical intervention .  I have reviewed the patients' chart and labs.  Questions were answered to the patient's satisfaction.     Lazaro Arms  MD

## 2010-12-05 NOTE — Transfer of Care (Signed)
Immediate Anesthesia Transfer of Care Note  Patient: NOELY KUHNLE  Procedure(s) Performed:  DILATATION & CURETTAGE/HYSTEROSCOPY WITH THERMACHOICE ABLATION - Hysteroscopy, Endometrial Ablation with Thermachoice.  13ml instilled into balloon, 13ml taken out of balloon, temp 86C.  Patient Location: PACU  Anesthesia Type: General  Level of Consciousness: awake  Airway & Oxygen Therapy: Patient Spontanous Breathing and non-rebreather face mask  Post-op Assessment: Report given to PACU RN, Post -op Vital signs reviewed and stable and Patient moving all extremities  Post vital signs: Reviewed and stable  Complications: No apparent anesthesia complications

## 2010-12-05 NOTE — Anesthesia Postprocedure Evaluation (Signed)
Anesthesia Post Note  Patient: Elizabeth Quinn  Procedure(s) Performed:  DILATATION & CURETTAGE/HYSTEROSCOPY WITH THERMACHOICE ABLATION - Hysteroscopy, Endometrial Ablation with Thermachoice.  13ml instilled into balloon, 13ml taken out of balloon, temp 86C.  Anesthesia type: General  Patient location: PACU  Post pain: Pain level controlled  Post assessment: Post-op Vital signs reviewed, Patient's Cardiovascular Status Stable, Respiratory Function Stable, Patent Airway, No signs of Nausea or vomiting and Pain level controlled  Last Vitals:  Filed Vitals:   12/05/10 1212  BP: 133/75  Pulse:   Temp: 98.8 F (37.1 C)  Resp: 15    Post vital signs: Reviewed and stable  Level of consciousness: awake and alert   Complications: No apparent anesthesia complications

## 2010-12-05 NOTE — Anesthesia Procedure Notes (Addendum)
Procedure Name: LMA Insertion Date/Time: 12/05/2010 11:20 AM Performed by: Minerva Areola Pre-anesthesia Checklist: Patient identified, Patient being monitored, Emergency Drugs available, Timeout performed and Suction available Patient Re-evaluated:Patient Re-evaluated prior to inductionOxygen Delivery Method: Circle System Utilized Preoxygenation: Pre-oxygenation with 100% oxygen Intubation Type: IV induction Ventilation: Mask ventilation without difficulty LMA: LMA inserted LMA Size: 4.0 Number of attempts: 1 Placement Confirmation: positive ETCO2 and breath sounds checked- equal and bilateral

## 2010-12-05 NOTE — H&P (Signed)
Elizabeth Quinn is an 40 y.o. female. G 5 P 3 A 2 with LMP 11/03/2010 with many year history of menometrorrhagia and dysmenorrhea.  No pain otherwise or problems.  Sonogram normal.  Alternatives discussed and patient desires to proceed with endometrial ablation.      Past Medical History  Diagnosis Date  . Acid reflux   . Shortness of breath     Past Surgical History  Procedure Date  . Induced abortion   . Dilation and curettage of uterus   . Tubal ligation 17 yrs ago    Family History  Problem Relation Age of Onset  . Anesthesia problems Neg Hx   . Hypotension Neg Hx   . Malignant hyperthermia Neg Hx   . Pseudochol deficiency Neg Hx     Social History:  reports that she has been smoking Cigarettes.  She has a 21 pack-year smoking history. She does not have any smokeless tobacco history on file. She reports that she drinks alcohol. She reports that she does not use illicit drugs.  Allergies: No Known Allergies    ROS  Review of Systems  Constitutional: Negative for fever, chills, weight loss, malaise/fatigue and diaphoresis.  HENT: Negative for hearing loss, ear pain, nosebleeds, congestion, sore throat, neck pain, tinnitus and ear discharge.   Eyes: Negative for blurred vision, double vision, photophobia, pain, discharge and redness.  Respiratory: Negative for cough, hemoptysis, sputum production, shortness of breath, wheezing and stridor.   Cardiovascular: Negative for chest pain, palpitations, orthopnea, claudication, leg swelling and PND.  Gastrointestinal: Positive for abdominal pain with menses. Negative for heartburn, nausea, vomiting, diarrhea, constipation, blood in stool and melena.  Genitourinary: Negative for dysuria, urgency, frequency, hematuria and flank pain.  Musculoskeletal: Negative for myalgias, back pain, joint pain and falls.  Skin: Negative for itching and rash.  Neurological: Negative for dizziness, tingling, tremors, sensory change, speech change,  focal weakness, seizures, loss of consciousness, weakness and headaches.  Endo/Heme/Allergies: Negative for environmental allergies and polydipsia. Does not bruise/bleed easily.  Psychiatric/Behavioral: Negative for depression, suicidal ideas, hallucinations, memory loss and substance abuse. The patient is not nervous/anxious and does not have insomnia.      Physical Exam Physical Exam  Vitals reviewed. Constitutional: She is oriented to person, place, and time. She appears well-developed and well-nourished.  HENT:  Head: Normocephalic and atraumatic.  Right Ear: External ear normal.  Left Ear: External ear normal.  Nose: Nose normal.  Mouth/Throat: Oropharynx is clear and moist.  Eyes: Conjunctivae and EOM are normal. Pupils are equal, round, and reactive to light. Right eye exhibits no discharge. Left eye exhibits no discharge. No scleral icterus.  Neck: Normal range of motion. Neck supple. No tracheal deviation present. No thyromegaly present.  Cardiovascular: Normal rate, regular rhythm, normal heart sounds and intact distal pulses.  Exam reveals no gallop and no friction rub.   No murmur heard. Respiratory: Effort normal and breath sounds normal. No respiratory distress. She has no wheezes. She has no rales. She exhibits no tenderness.  GI: Soft. Bowel sounds are normal. She exhibits no distension and no mass. There is tenderness. There is no rebound and no guarding.  Genitourinary:   Vulva is normal without lesions Vagina is pink moist without discharge Cervix normal in appearance and pap is normal Uterus is normal size shape contour and is non-tender. Adnexa is negative with normal sized ovaries by sonogram  Musculoskeletal: Normal range of motion. She exhibits no edema and no tenderness.  Neurological: She is alert  and oriented to person, place, and time. She has normal reflexes. She displays normal reflexes. No cranial nerve deficit. She exhibits normal muscle tone. Coordination  normal.  Skin: Skin is warm and dry. No rash noted. No erythema. No pallor.  Psychiatric: She has a normal mood and affect. Her behavior is normal. Judgment and thought content normal.     Recent Results (from the past 336 hour(s))  URINALYSIS, ROUTINE W REFLEX MICROSCOPIC   Collection Time   11/28/10 10:33 AM      Component Value Range   Color, Urine YELLOW  YELLOW    Appearance CLEAR  CLEAR    Specific Gravity, Urine <1.005 (*) 1.005 - 1.030    pH 5.5  5.0 - 8.0    Glucose, UA NEGATIVE  NEGATIVE (mg/dL)   Hgb urine dipstick NEGATIVE  NEGATIVE    Bilirubin Urine NEGATIVE  NEGATIVE    Ketones, ur NEGATIVE  NEGATIVE (mg/dL)   Protein, ur NEGATIVE  NEGATIVE (mg/dL)   Urobilinogen, UA 0.2  0.0 - 1.0 (mg/dL)   Nitrite NEGATIVE  NEGATIVE    Leukocytes, UA NEGATIVE  NEGATIVE   SURGICAL PCR SCREEN   Collection Time   11/28/10 10:40 AM      Component Value Range   MRSA, PCR NEGATIVE  NEGATIVE    Staphylococcus aureus NEGATIVE  NEGATIVE   CBC   Collection Time   11/28/10 11:00 AM      Component Value Range   WBC 4.7  4.0 - 10.5 (K/uL)   RBC 4.63  3.87 - 5.11 (MIL/uL)   Hemoglobin 13.2  12.0 - 15.0 (g/dL)   HCT 62.9  52.8 - 41.3 (%)   MCV 83.4  78.0 - 100.0 (fL)   MCH 28.5  26.0 - 34.0 (pg)   MCHC 34.2  30.0 - 36.0 (g/dL)   RDW 24.4  01.0 - 27.2 (%)   Platelets 253  150 - 400 (K/uL)  COMPREHENSIVE METABOLIC PANEL   Collection Time   11/28/10 11:00 AM      Component Value Range   Sodium 138  135 - 145 (mEq/L)   Potassium 4.0  3.5 - 5.1 (mEq/L)   Chloride 104  96 - 112 (mEq/L)   CO2 27  19 - 32 (mEq/L)   Glucose, Bld 66 (*) 70 - 99 (mg/dL)   BUN 6  6 - 23 (mg/dL)   Creatinine, Ser 5.36  0.50 - 1.10 (mg/dL)   Calcium 9.8  8.4 - 64.4 (mg/dL)   Total Protein 7.2  6.0 - 8.3 (g/dL)   Albumin 3.7  3.5 - 5.2 (g/dL)   AST 11  0 - 37 (U/L)   ALT 8  0 - 35 (U/L)   Alkaline Phosphatase 62  39 - 117 (U/L)   Total Bilirubin 0.2 (*) 0.3 - 1.2 (mg/dL)   GFR calc non Af Amer 73 (*) >90  (mL/min)   GFR calc Af Amer 85 (*) >90 (mL/min)  HCG, QUANTITATIVE, PREGNANCY   Collection Time   11/28/10 11:00 AM      Component Value Range   hCG, Beta Chain, Quant, S 1  <5 (mIU/mL)     Assessment/Plan: 1.  Menometrorrhagia 2.  Dysmenorrhea 3.  Normal sonogram  Proceed with hysteroscopy uterine currettage and endometrial ablation.  Pt understands risks benefits and therapeutic options.  Tyreka Henneke H 12/05/2010, 7:58 AM

## 2010-12-05 NOTE — Anesthesia Preprocedure Evaluation (Addendum)
Anesthesia Evaluation  Name, MR# and DOB Patient awake  General Assessment Comment  Reviewed: Allergy & Precautions, H&P , NPO status , Patient's Chart, lab work & pertinent test results  Airway Mallampati: II  Neck ROM: Full    Dental  (+) Teeth Intact   Pulmonary (+) shortness of breath Current Smoker (am cough)  clear to auscultation        Cardiovascular Regular Normal    Neuro/Psych    GI/Hepatic GERD (inactive now) Medicated and Controlled  Endo/Other    Renal/GU      Musculoskeletal   Abdominal   Peds  Hematology   Anesthesia Other Findings   Reproductive/Obstetrics                           Anesthesia Physical Anesthesia Plan  ASA: II  Anesthesia Plan: General   Post-op Pain Management:    Induction: Intravenous  Airway Management Planned: LMA  Additional Equipment:   Intra-op Plan:   Post-operative Plan: Extubation in OR  Informed Consent: I have reviewed the patients History and Physical, chart, labs and discussed the procedure including the risks, benefits and alternatives for the proposed anesthesia with the patient or authorized representative who has indicated his/her understanding and acceptance.     Plan Discussed with:   Anesthesia Plan Comments:         Anesthesia Quick Evaluation

## 2010-12-05 NOTE — Op Note (Signed)
Preoperative diagnosis: Menometrorrhagia                                        Dysmenorrhea   Postoperative diagnoses: Same as above  Procedure: Hysteroscopy, uterine curettage, endometrial ablation  Surgeon: Despina Hidden MD  Anesthesia: Laryngeal mask airway  Findings: The endometrium was normal. There were no fibroid or other abnormalities.  Description of operation: The patient was taken to the operating room and placed in the supine position. She underwent general anesthesia using the laryngeal mask airway. She was placed in the dorsal lithotomy position and prepped and draped in the usual sterile fashion. A Graves speculum was placed and the anterior cervical lip was grasped with a single-tooth tenaculum. The cervix was dilated serially to allow passage of the hysteroscope. Diagnostic hysteroscopy was performed and was found to be normal.   The ThermaChoice 3 endometrial ablation balloon was then used were 14 cc of D5W was required to maintain a pressure of 190-200 mm of mercury throughout the procedure. All of the equipment worked well throughout the procedure. All of the fluid was returned at the end of the procedure. Total therapy time was 9:14.  The patient was awakened from anesthesia and taken to the recovery room in good stable condition all counts were correct. She received 1 g of Ancef and 30 mg of Toradol preoperatively. She will be discharged from the recovery room and followed up in the office next week.  Yazlin Ekblad H 12/05/2010 12:02 PM

## 2011-04-15 ENCOUNTER — Ambulatory Visit: Payer: Medicaid Other | Admitting: Gastroenterology

## 2011-04-15 ENCOUNTER — Telehealth: Payer: Self-pay | Admitting: Gastroenterology

## 2011-04-15 NOTE — Telephone Encounter (Signed)
Pt was a no show

## 2011-04-15 NOTE — Telephone Encounter (Signed)
Patient has no showed/late cancelled at least three appt since 05/2010. Needs provider approval prior to rescheduling in the future.

## 2011-11-25 ENCOUNTER — Other Ambulatory Visit (HOSPITAL_COMMUNITY)
Admission: RE | Admit: 2011-11-25 | Discharge: 2011-11-25 | Disposition: A | Discharge status: Home / Self Care | Payer: Medicaid Other | Source: Ambulatory Visit | Attending: Obstetrics & Gynecology | Admitting: Obstetrics & Gynecology

## 2011-11-25 DIAGNOSIS — Z01419 Encounter for gynecological examination (general) (routine) without abnormal findings: Secondary | ICD-10-CM | POA: Insufficient documentation

## 2011-11-27 ENCOUNTER — Other Ambulatory Visit (HOSPITAL_COMMUNITY): Payer: Self-pay | Admitting: *Deleted

## 2011-11-27 DIAGNOSIS — R1310 Dysphagia, unspecified: Secondary | ICD-10-CM

## 2011-12-02 ENCOUNTER — Ambulatory Visit (HOSPITAL_COMMUNITY)
Admission: RE | Admit: 2011-12-02 | Discharge: 2011-12-02 | Disposition: A | Discharge status: Home / Self Care | Payer: Medicaid Other | Source: Ambulatory Visit | Attending: *Deleted | Admitting: *Deleted

## 2011-12-02 DIAGNOSIS — R1310 Dysphagia, unspecified: Secondary | ICD-10-CM | POA: Insufficient documentation

## 2011-12-31 ENCOUNTER — Ambulatory Visit: Payer: Medicaid Other | Admitting: Gastroenterology

## 2011-12-31 ENCOUNTER — Telehealth: Payer: Self-pay | Admitting: *Deleted

## 2011-12-31 NOTE — Telephone Encounter (Signed)
Pt was a no show

## 2011-12-31 NOTE — Telephone Encounter (Signed)
Left a message with [facility]. (C[place]) to call us back about this pt.

## 2011-12-31 NOTE — Telephone Encounter (Signed)
Patient has no showed four times since spring of 2012. No showed twice this year.   Please let referring provider know. Please do not schedule any more appointments with this patient without provider approval!

## 2013-05-18 ENCOUNTER — Emergency Department (HOSPITAL_COMMUNITY): Payer: Medicaid Other

## 2013-05-18 ENCOUNTER — Emergency Department (HOSPITAL_COMMUNITY)
Admission: EM | Admit: 2013-05-18 | Discharge: 2013-05-18 | Disposition: A | Discharge status: Home / Self Care | Payer: Medicaid Other | Attending: Emergency Medicine | Admitting: Emergency Medicine

## 2013-05-18 ENCOUNTER — Encounter (HOSPITAL_COMMUNITY): Payer: Self-pay | Admitting: Emergency Medicine

## 2013-05-18 DIAGNOSIS — F17200 Nicotine dependence, unspecified, uncomplicated: Secondary | ICD-10-CM | POA: Insufficient documentation

## 2013-05-18 DIAGNOSIS — Z87828 Personal history of other (healed) physical injury and trauma: Secondary | ICD-10-CM | POA: Insufficient documentation

## 2013-05-18 DIAGNOSIS — M6580 Other synovitis and tenosynovitis, unspecified site: Secondary | ICD-10-CM | POA: Insufficient documentation

## 2013-05-18 DIAGNOSIS — Z8719 Personal history of other diseases of the digestive system: Secondary | ICD-10-CM | POA: Insufficient documentation

## 2013-05-18 DIAGNOSIS — M778 Other enthesopathies, not elsewhere classified: Secondary | ICD-10-CM

## 2013-05-18 MED ORDER — ONDANSETRON HCL 4 MG PO TABS
4.0000 mg | ORAL_TABLET | Freq: Once | ORAL | Status: AC
  Administered 2013-05-18: 4 mg via ORAL
  Filled 2013-05-18: qty 1

## 2013-05-18 MED ORDER — MELOXICAM 7.5 MG PO TABS: ORAL_TABLET | ORAL | Status: DC

## 2013-05-18 MED ORDER — KETOROLAC TROMETHAMINE 10 MG PO TABS
10.0000 mg | ORAL_TABLET | Freq: Once | ORAL | Status: AC
  Administered 2013-05-18: 10 mg via ORAL
  Filled 2013-05-18: qty 1

## 2013-05-18 MED ORDER — PREDNISONE 50 MG PO TABS
60.0000 mg | ORAL_TABLET | Freq: Once | ORAL | Status: AC
  Administered 2013-05-18: 60 mg via ORAL
  Filled 2013-05-18 (×2): qty 1

## 2013-05-18 MED ORDER — DEXAMETHASONE 6 MG PO TABS: ORAL_TABLET | ORAL | Status: DC

## 2013-05-18 NOTE — ED Notes (Signed)
Pt reports feeling pop and pain in right elbow earlier today.  Reports pain has progressed during the day.

## 2013-05-18 NOTE — ED Provider Notes (Signed)
Medical screening examination/treatment/procedure(s) were performed by non-physician practitioner and as supervising physician I was immediately available for consultation/collaboration.   EKG Interpretation None        Joanette Silveria, MD 05/18/13 2337 

## 2013-05-18 NOTE — Discharge Instructions (Signed)
Tendinitis   Tendinitis is redness, soreness, and puffiness (inflammation) of the tendons. Tendons are band-like tissues that connect muscle to bone. Tendinitis often happens in the shoulders, heels, or elbows. It might happen if your job involves doing the same motions over and over.  HOME CARE  · Use a sling or splint as told by your doctor.  · Put ice on the injured area.  · Put ice in a plastic bag.  · Place a towel between your skin and the bag.  · Leave the ice on for 15-20 minutes, 03-04 times a day.  · Avoid using your injured arm or leg until the pain goes away.  · Do gentle exercises only as told by your doctor. Stop exercises if the pain gets worse, unless your doctor tells you otherwise.  · Only take medicines as told by your doctor.  GET HELP RIGHT AWAY IF:  · Your pain and puffiness get worse.  · You have new problems, such as loss of feeling (numbness) in the hands.  MAKE SURE YOU:  · Understand these instructions.  · Will watch your condition.  · Will get help right away if you are not doing well or get worse.  Document Released: 05/24/2010 Document Revised: 05/06/2011 Document Reviewed: 05/24/2010  [employer]® Patient Information ©2014 [employer].

## 2013-05-18 NOTE — ED Provider Notes (Signed)
CSN: [id number]     Arrival date & time 05/18/13  1859 History   First MD Initiated Contact with Patient 05/18/13 2121     Chief Complaint  Patient presents with  . Arm Pain     (Consider location/radiation/quality/duration/timing/severity/associated sxs/prior Treatment) HPI Comments: Patient is a 43 year old female who presents to the emergency department with complaint of right elbow pain. The patient states that this problem started approximately a week ago. The pain got progressively worse today to the point that she states that she could not get it to stop hurting using Aleve. It is of note that the patient has had major lacerations to the left elbow and arm. She states that she tends to use her right even more. The patient is right-hand dominant. She has not had any recent fall or injury to the right arm. There's been no recent operations or procedures appreciated. He request evaluation concerning her right elbow.  Patient is a 43 y.o. female presenting with arm pain. The history is provided by the patient.  Arm Pain Associated symptoms include arthralgias. Pertinent negatives include no abdominal pain, chest pain, coughing or neck pain.    Past Medical History  Diagnosis Date  . Acid reflux   . Shortness of breath    Past Surgical History  Procedure Laterality Date  . Induced abortion    . Dilation and curettage of uterus    . Tubal ligation  17 yrs ago   Family History  Problem Relation Age of Onset  . Anesthesia problems Neg Hx   . Hypotension Neg Hx   . Malignant hyperthermia Neg Hx   . Pseudochol deficiency Neg Hx    History  Substance Use Topics  . Smoking status: Current Every Day Smoker -- 1.00 packs/day for 21 years    Types: Cigarettes  . Smokeless tobacco: Not on file  . Alcohol Use: Yes     Comment: weekends-drinks 12 pack   OB History   Grav Para Term Preterm Abortions TAB SAB Ect Mult Living   5         3     Review of Systems  Constitutional:  Negative for activity change.       All ROS Neg except as noted in HPI  HENT: Negative for nosebleeds.   Eyes: Negative for photophobia and discharge.  Respiratory: Negative for cough, shortness of breath and wheezing.   Cardiovascular: Negative for chest pain and palpitations.  Gastrointestinal: Negative for abdominal pain and blood in stool.  Genitourinary: Negative for dysuria, frequency and hematuria.  Musculoskeletal: Positive for arthralgias. Negative for back pain and neck pain.  Skin: Negative.   Neurological: Negative for dizziness, seizures and speech difficulty.  Psychiatric/Behavioral: Negative for hallucinations and confusion.      Allergies  Review of patient's allergies indicates no known allergies.  Home Medications   Current Outpatient Rx  Name  Route  Sig  Dispense  Refill  . naproxen sodium (ALEVE) 220 MG tablet   Oral   Take 220-440 mg by mouth daily as needed (for pain).          BP 161/98  Pulse 95  Temp(Src) 98.2 F (36.8 C) (Oral)  Resp 17  Ht 5\' 9"  (1.753 m)  Wt 168 lb (76.204 kg)  BMI 24.80 kg/m2  SpO2 100% Physical Exam  Nursing note and vitals reviewed. Constitutional: She is oriented to person, place, and time. She appears well-developed and well-nourished.  Non-toxic appearance.  HENT:  Head: Normocephalic.  Right Ear: Tympanic membrane and external ear normal.  Left Ear: Tympanic membrane and external ear normal.  Eyes: EOM and lids are normal. Pupils are equal, round, and reactive to light.  Neck: Normal range of motion. Neck supple. Carotid bruit is not present.  Cardiovascular: Normal rate, regular rhythm, normal heart sounds, intact distal pulses and normal pulses.   Pulmonary/Chest: Breath sounds normal. No respiratory distress.  Abdominal: Soft. Bowel sounds are normal. There is no tenderness. There is no guarding.  Musculoskeletal: Normal range of motion.  There is full range of motion of the right shoulder. There is pain with  flexion and extension of the elbow on the right more on the lateral and medial aspect. There is no hot joints appreciated. There is no redness. The elbow is not hot. There is no effusion noted of the joint. The radial pulses are 2+ bilaterally. The capillary refill is less than 2 seconds.  Lymphadenopathy:       Head (right side): No submandibular adenopathy present.       Head (left side): No submandibular adenopathy present.    She has no cervical adenopathy.  Neurological: She is alert and oriented to person, place, and time. She has normal strength. No cranial nerve deficit or sensory deficit.  Skin: Skin is warm and dry.  Psychiatric: She has a normal mood and affect. Her speech is normal.    ED Course  Procedures (including critical care time) Labs Review Labs Reviewed - No data to display Imaging Review Dg Elbow Complete Right  05/18/2013   CLINICAL DATA:  Progressive elbow pain. The patient felt a pop earlier.  EXAM: RIGHT ELBOW - COMPLETE 3+ VIEW  COMPARISON:  None.  FINDINGS: The right elbow is located. No acute bone or soft tissue abnormality is present. There is no significant effusion.  IMPRESSION: Negative right elbow.   Electronically Signed   By: Gennette Pachris  Mattern M.D.   On: 05/18/2013 20:18     EKG Interpretation None      MDM X-ray of the right elbow is negative for fracture or dislocation or effusion. The vital signs are well within normal limits, with exception of the blood pressure being 161/98. Suspect the patient has a tendinitis involving the right elbow. The patient is advised to obtain an elbow splint from her local drugstore. She will be treated with Decadron and Mobic. Patient is to see the orthopedic specialist if not improving.    Final diagnoses:  None    **I have reviewed nursing notes, vital signs, and all appropriate lab and imaging results for this patient.*  And  Kathie DikeHobson M Xin Klawitter, PA-C 05/18/13 2139

## 2013-10-12 ENCOUNTER — Encounter (HOSPITAL_COMMUNITY): Payer: Self-pay | Admitting: Emergency Medicine

## 2013-10-12 ENCOUNTER — Emergency Department (HOSPITAL_COMMUNITY)
Admission: EM | Admit: 2013-10-12 | Discharge: 2013-10-12 | Disposition: A | Discharge status: Home / Self Care | Payer: Medicaid Other | Attending: Emergency Medicine | Admitting: Emergency Medicine

## 2013-10-12 ENCOUNTER — Emergency Department (HOSPITAL_COMMUNITY): Payer: Medicaid Other

## 2013-10-12 DIAGNOSIS — Z720 Tobacco use: Secondary | ICD-10-CM

## 2013-10-12 DIAGNOSIS — J209 Acute bronchitis, unspecified: Secondary | ICD-10-CM | POA: Insufficient documentation

## 2013-10-12 DIAGNOSIS — Z8719 Personal history of other diseases of the digestive system: Secondary | ICD-10-CM | POA: Insufficient documentation

## 2013-10-12 DIAGNOSIS — Z792 Long term (current) use of antibiotics: Secondary | ICD-10-CM | POA: Insufficient documentation

## 2013-10-12 DIAGNOSIS — R079 Chest pain, unspecified: Secondary | ICD-10-CM | POA: Insufficient documentation

## 2013-10-12 DIAGNOSIS — J40 Bronchitis, not specified as acute or chronic: Secondary | ICD-10-CM

## 2013-10-12 DIAGNOSIS — F17200 Nicotine dependence, unspecified, uncomplicated: Secondary | ICD-10-CM | POA: Insufficient documentation

## 2013-10-12 MED ORDER — AEROCHAMBER Z-STAT PLUS/MEDIUM MISC: 1.0000 | Freq: Once | Status: DC

## 2013-10-12 MED ORDER — SULFAMETHOXAZOLE-TMP DS 800-160 MG PO TABS: 1.0000 | ORAL_TABLET | Freq: Two times a day (BID) | ORAL | Status: DC

## 2013-10-12 MED ORDER — IPRATROPIUM BROMIDE 0.02 % IN SOLN
0.5000 mg | Freq: Once | RESPIRATORY_TRACT | Status: AC
  Administered 2013-10-12: 0.5 mg via RESPIRATORY_TRACT
  Filled 2013-10-12: qty 2.5

## 2013-10-12 MED ORDER — ALBUTEROL SULFATE (2.5 MG/3ML) 0.083% IN NEBU
5.0000 mg | INHALATION_SOLUTION | Freq: Once | RESPIRATORY_TRACT | Status: AC
  Administered 2013-10-12: 5 mg via RESPIRATORY_TRACT
  Filled 2013-10-12: qty 6

## 2013-10-12 MED ORDER — ALBUTEROL SULFATE HFA 108 (90 BASE) MCG/ACT IN AERS
2.0000 | INHALATION_SPRAY | RESPIRATORY_TRACT | Status: DC | PRN
  Administered 2013-10-12: 2 via RESPIRATORY_TRACT
  Filled 2013-10-12: qty 6.7

## 2013-10-12 NOTE — ED Notes (Signed)
Complain of chest pain x three days. States she has had a cough and is a heavy smoker

## 2013-10-12 NOTE — ED Provider Notes (Signed)
CSN: [id number]     Arrival date & time 10/12/13  1612 History   First MD Initiated Contact with Patient 10/12/13 1626     Chief Complaint  Patient presents with  . Chest Pain     (Consider location/radiation/quality/duration/timing/severity/associated sxs/prior Treatment) The history is provided by the patient.   Elizabeth Quinn is a 43 y.o. female who complains of 3 days of constant chest pain, it is sharp in nature. There is no change with walking. Deep breathing or lying supine. She has a nonproductive cough. She feels short of breath at times. She denies fever, chills, nausea, vomiting, weakness, or dizziness. She is also concerned about swelling in both forearms and difficulty grasping things with her right hand. She denies leg pain. There are no other known modifying factors.    Past Medical History  Diagnosis Date  . Acid reflux   . Shortness of breath    Past Surgical History  Procedure Laterality Date  . Induced abortion    . Dilation and curettage of uterus    . Tubal ligation  17 yrs ago   Family History  Problem Relation Age of Onset  . Anesthesia problems Neg Hx   . Hypotension Neg Hx   . Malignant hyperthermia Neg Hx   . Pseudochol deficiency Neg Hx    History  Substance Use Topics  . Smoking status: Current Every Day Smoker -- 1.00 packs/day for 21 years    Types: Cigarettes  . Smokeless tobacco: Not on file  . Alcohol Use: Yes     Comment: weekends-drinks 12 pack   OB History   Grav Para Term Preterm Abortions TAB SAB Ect Mult Living   5         3     Review of Systems  All other systems reviewed and are negative.     Allergies  Review of patient's allergies indicates no known allergies.  Home Medications   Prior to Admission medications   Medication Sig Start Date End Date Taking? Authorizing Provider  sulfamethoxazole-trimethoprim (BACTRIM DS) 800-160 MG per tablet Take 1 tablet by mouth 2 (two) times daily. 10/12/13   Flint MelterElliott L Kennith Morss, MD    BP 135/87  Pulse 84  Temp(Src) 98.1 F (36.7 C) (Oral)  Resp 24  Ht 5\' 9"  (1.753 m)  Wt 161 lb (73.029 kg)  BMI 23.76 kg/m2  SpO2 92% Physical Exam  Nursing note and vitals reviewed. Constitutional: She is oriented to person, place, and time. She appears well-developed and well-nourished.  HENT:  Head: Normocephalic and atraumatic.  Eyes: Conjunctivae and EOM are normal. Pupils are equal, round, and reactive to light.  Neck: Normal range of motion and phonation normal. Neck supple.  Cardiovascular: Normal rate, regular rhythm and intact distal pulses.   Pulmonary/Chest: Effort normal and breath sounds normal. No respiratory distress. She has no wheezes. She exhibits no tenderness (There is no chest deformity, or crepitation.).  Abdominal: Soft. She exhibits no distension. There is no tenderness. There is no guarding.  Musculoskeletal: Normal range of motion.  No deformities of the arms or legs. No calf tenderness. Negative Homans sign bilaterally.  Neurological: She is alert and oriented to person, place, and time. She exhibits normal muscle tone.  Skin: Skin is warm and dry.  Psychiatric: She has a normal mood and affect. Her behavior is normal. Judgment and thought content normal.    ED Course  Procedures (including critical care time)  Medications  albuterol (PROVENTIL) (2.5 MG/3ML) 0.083% nebulizer  solution 5 mg (5 mg Nebulization Given 10/12/13 1707)  ipratropium (ATROVENT) nebulizer solution 0.5 mg (0.5 mg Nebulization Given 10/12/13 1708)    Patient Vitals for the past 24 hrs:  BP Temp Temp src Pulse Resp SpO2 Height Weight  10/12/13 1730 135/87 mmHg - - 84 24 92 % - -  10/12/13 1711 - - - - - 100 % - -  10/12/13 1700 146/105 mmHg - - 71 17 99 % - -  10/12/13 1630 140/104 mmHg - - 81 24 100 % - -  10/12/13 1629 152/101 mmHg 98.1 F (36.7 C) Oral 76 16 100 % - -  10/12/13 1626 - - - - - - 5\' 9"  (1.753 m) 161 lb (73.029 kg)       Labs Review Labs Reviewed - No  data to display  Imaging Review Dg Chest 2 View  10/12/2013   CLINICAL DATA:  Chest pain for 3 days.  EXAM: CHEST  2 VIEW  COMPARISON:  01/02/2009  FINDINGS: The heart size and mediastinal contours are within normal limits. Both lungs are clear. The visualized skeletal structures are unremarkable.  IMPRESSION: Normal exam.   Electronically Signed   By: Geanie Cooley M.D.   On: 10/12/2013 16:57     EKG Interpretation   Date/Time:  Tuesday October 12 2013 16:27:36 EDT Ventricular Rate:  81 PR Interval:  159 QRS Duration: 89 QT Interval:  387 QTC Calculation: 449 R Axis:   72 Text Interpretation:  Sinus rhythm since last tracing no significant  change Confirmed by Effie Shy  MD, Geroge Gilliam [phone]) on 10/12/2013 4:32:25 PM      MDM   Final diagnoses:  Bronchitis  Tobacco abuse   Attenuations consistent with mild bronchitis related to tobacco abuse. Doubt ACS, PE, or pneumonia.  Nursing Notes Reviewed/ Care Coordinated Applicable Imaging Reviewed Interpretation of Laboratory Data incorporated into ED treatment  The patient appears reasonably screened and/or stabilized for discharge and I doubt any other medical condition or other [facility] requiring further screening, evaluation, or treatment in the ED at this time prior to discharge.  Plan: Home Medications- Septra; Home Treatments- rest; return here if the recommended treatment, does not improve the symptoms; Recommended follow up- PCP prn    Flint Melter, MD 10/13/13 [phone]

## 2013-10-12 NOTE — Discharge Instructions (Signed)
Use the inhaler, 2 puffs, every 4 hours as needed for cough, or trouble breathing. Try to stop smoking as soon as possible.     How to Use an Inhaler Proper inhaler technique is very important. Good technique ensures that the medicine reaches the lungs. Poor technique results in depositing the medicine on the tongue and back of the throat rather than in the airways. If you do not use the inhaler with good technique, the medicine will not help you. STEPS TO FOLLOW IF USING AN INHALER WITHOUT AN EXTENSION TUBE 1. Remove the cap from the inhaler. 2. If you are using the inhaler for the first time, you will need to prime it. Shake the inhaler for 5 seconds and release four puffs into the air, away from your face. Ask your health care provider or pharmacist if you have questions about priming your inhaler. 3. Shake the inhaler for 5 seconds before each breath in (inhalation). 4. Position the inhaler so that the top of the canister faces up. 5. Put your index finger on the top of the medicine canister. Your thumb supports the bottom of the inhaler. 6. Open your mouth. 7. Either place the inhaler between your teeth and place your lips tightly around the mouthpiece, or hold the inhaler 1-2 inches away from your open mouth. If you are unsure of which technique to use, ask your health care provider. 8. Breathe out (exhale) normally and as completely as possible. 9. Press the canister down with your index finger to release the medicine. 10. At the same time as the canister is pressed, inhale deeply and slowly until your lungs are completely filled. This should take 4-6 seconds. Keep your tongue down. 11. Hold the medicine in your lungs for 5-10 seconds (10 seconds is best). This helps the medicine get into the small airways of your lungs. 12. Breathe out slowly, through pursed lips. Whistling is an example of pursed lips. 13. Wait at least 15-30 seconds between puffs. Continue with the above steps until  you have taken the number of puffs your health care provider has ordered. Do not use the inhaler more than your health care provider tells you. 14. Replace the cap on the inhaler. 15. Follow the directions from your health care provider or the inhaler insert for cleaning the inhaler. STEPS TO FOLLOW IF USING AN INHALER WITH AN EXTENSION (SPACER) 1. Remove the cap from the inhaler. 2. If you are using the inhaler for the first time, you will need to prime it. Shake the inhaler for 5 seconds and release four puffs into the air, away from your face. Ask your health care provider or pharmacist if you have questions about priming your inhaler. 3. Shake the inhaler for 5 seconds before each breath in (inhalation). 4. Place the open end of the spacer onto the mouthpiece of the inhaler. 5. Position the inhaler so that the top of the canister faces up and the spacer mouthpiece faces you. 6. Put your index finger on the top of the medicine canister. Your thumb supports the bottom of the inhaler and the spacer. 7. Breathe out (exhale) normally and as completely as possible. 8. Immediately after exhaling, place the spacer between your teeth and into your mouth. Close your lips tightly around the spacer. 9. Press the canister down with your index finger to release the medicine. 10. At the same time as the canister is pressed, inhale deeply and slowly until your lungs are completely filled. This should take 4-6  seconds. Keep your tongue down and out of the way. 11. Hold the medicine in your lungs for 5-10 seconds (10 seconds is best). This helps the medicine get into the small airways of your lungs. Exhale. 12. Repeat inhaling deeply through the spacer mouthpiece. Again hold that breath for up to 10 seconds (10 seconds is best). Exhale slowly. If it is difficult to take this second deep breath through the spacer, breathe normally several times through the spacer. Remove the spacer from your mouth. 13. Wait at  least 15-30 seconds between puffs. Continue with the above steps until you have taken the number of puffs your health care provider has ordered. Do not use the inhaler more than your health care provider tells you. 14. Remove the spacer from the inhaler, and place the cap on the inhaler. 15. Follow the directions from your health care provider or the inhaler insert for cleaning the inhaler and spacer. If you are using different kinds of inhalers, use your quick relief medicine to open the airways 10-15 minutes before using a steroid if instructed to do so by your health care provider. If you are unsure which inhalers to use and the order of using them, ask your health care provider, nurse, or respiratory therapist. If you are using a steroid inhaler, always rinse your mouth with water after your last puff, then gargle and spit out the water. Do not swallow the water. AVOID:  Inhaling before or after starting the spray of medicine. It takes practice to coordinate your breathing with triggering the spray.  Inhaling through the nose (rather than the mouth) when triggering the spray. HOW TO DETERMINE IF YOUR INHALER IS FULL OR NEARLY EMPTY You cannot know when an inhaler is empty by shaking it. A few inhalers are now being made with dose counters. Ask your health care provider for a prescription that has a dose counter if you feel you need that extra help. If your inhaler does not have a counter, ask your health care provider to help you determine the date you need to refill your inhaler. Write the refill date on a calendar or your inhaler canister. Refill your inhaler 7-10 days before it runs out. Be sure to keep an adequate supply of medicine. This includes making sure it is not expired, and that you have a spare inhaler.  SEEK MEDICAL CARE IF:   Your symptoms are only partially relieved with your inhaler.  You are having trouble using your inhaler.  You have some increase in phlegm. SEEK IMMEDIATE  MEDICAL CARE IF:   You feel little or no relief with your inhalers. You are still wheezing and are feeling shortness of breath or tightness in your chest or both.  You have dizziness, headaches, or a fast heart rate.  You have chills, fever, or night sweats.  You have a noticeable increase in phlegm production, or there is blood in the phlegm. MAKE SURE YOU:   Understand these instructions.  Will watch your condition.  Will get help right away if you are not doing well or get worse. Document Released: 02/09/2000 Document Revised: 12/02/2012 Document Reviewed: 09/10/2012 [facility] Patient Information 2015 [place], [place]. This information is not intended to replace advice given to you by your health care provider. Make sure you discuss any questions you have with your health care provider.  Smoking Cessation Quitting smoking is important to your health and has many advantages. However, it is not always easy to quit since nicotine is a very  addictive drug. Oftentimes, people try 3 times or more before being able to quit. This document explains the best ways for you to prepare to quit smoking. Quitting takes hard work and a lot of effort, but you can do it. ADVANTAGES OF QUITTING SMOKING  You will live longer, feel better, and live better.  Your body will feel the impact of quitting smoking almost immediately.  Within 20 minutes, blood pressure decreases. Your pulse returns to its normal level.  After 8 hours, carbon monoxide levels in the blood return to normal. Your oxygen level increases.  After 24 hours, the chance of having a heart attack starts to decrease. Your breath, hair, and body stop smelling like smoke.  After 48 hours, damaged nerve endings begin to recover. Your sense of taste and smell improve.  After 72 hours, the body is virtually free of nicotine. Your bronchial tubes relax and breathing becomes easier.  After 2 to 12 weeks, lungs can hold more air. Exercise  becomes easier and circulation improves.  The risk of having a heart attack, stroke, cancer, or lung disease is greatly reduced.  After 1 year, the risk of coronary heart disease is cut in half.  After 5 years, the risk of stroke falls to the same as a nonsmoker.  After 10 years, the risk of lung cancer is cut in half and the risk of other cancers decreases significantly.  After 15 years, the risk of coronary heart disease drops, usually to the level of a nonsmoker.  If you are pregnant, quitting smoking will improve your chances of having a healthy baby.  The people you live with, especially any children, will be healthier.  You will have extra money to spend on things other than cigarettes. QUESTIONS TO THINK ABOUT BEFORE ATTEMPTING TO QUIT You may want to talk about your answers with your health care provider.  Why do you want to quit?  If you tried to quit in the past, what helped and what did not?  What will be the most difficult situations for you after you quit? How will you plan to handle them?  Who can help you through the tough times? Your family? Friends? A health care provider?  What pleasures do you get from smoking? What ways can you still get pleasure if you quit? Here are some questions to ask your health care provider:  How can you help me to be successful at quitting?  What medicine do you think would be best for me and how should I take it?  What should I do if I need more help?  What is smoking withdrawal like? How can I get information on withdrawal? GET READY  Set a quit date.  Change your environment by getting rid of all cigarettes, ashtrays, matches, and lighters in your home, car, or work. Do not let people smoke in your home.  Review your past attempts to quit. Think about what worked and what did not. GET SUPPORT AND ENCOURAGEMENT You have a better chance of being successful if you have help. You can get support in many ways.  Tell your  family, friends, and coworkers that you are going to quit and need their support. Ask them not to smoke around you.  Get individual, group, or telephone counseling and support. Programs are available at [facility] and health centers. Call your local health department for information about programs in your area.  Spiritual beliefs and practices may help some smokers quit.  Download a "quit  meter" on your computer to keep track of quit statistics, such as how long you have gone without smoking, cigarettes not smoked, and money saved.  Get a self-help book about quitting smoking and staying off tobacco. LEARN NEW SKILLS AND BEHAVIORS  Distract yourself from urges to smoke. Talk to someone, go for a walk, or occupy your time with a task.  Change your normal routine. Take a different route to work. Drink tea instead of coffee. Eat breakfast in a different place.  Reduce your stress. Take a hot bath, exercise, or read a book.  Plan something enjoyable to do every day. Reward yourself for not smoking.  Explore interactive web-based programs that specialize in helping you quit. GET MEDICINE AND USE IT CORRECTLY Medicines can help you stop smoking and decrease the urge to smoke. Combining medicine with the above behavioral methods and support can greatly increase your chances of successfully quitting smoking.  Nicotine replacement therapy helps deliver nicotine to your body without the negative effects and risks of smoking. Nicotine replacement therapy includes nicotine gum, lozenges, inhalers, nasal sprays, and skin patches. Some may be available over-the-counter and others require a prescription.  Antidepressant medicine helps people abstain from smoking, but how this works is unknown. This medicine is available by prescription.  Nicotinic receptor partial agonist medicine simulates the effect of nicotine in your brain. This medicine is available by prescription. Ask your health care  provider for advice about which medicines to use and how to use them based on your health history. Your health care provider will tell you what side effects to look out for if you choose to be on a medicine or therapy. Carefully read the information on the package. Do not use any other product containing nicotine while using a nicotine replacement product.  RELAPSE OR DIFFICULT SITUATIONS Most relapses occur within the first 3 months after quitting. Do not be discouraged if you start smoking again. Remember, most people try several times before finally quitting. You may have symptoms of withdrawal because your body is used to nicotine. You may crave cigarettes, be irritable, feel very hungry, cough often, get headaches, or have difficulty concentrating. The withdrawal symptoms are only temporary. They are strongest when you first quit, but they will go away within 10-14 days. To reduce the chances of relapse, try to:  Avoid drinking alcohol. Drinking lowers your chances of successfully quitting.  Reduce the amount of caffeine you consume. Once you quit smoking, the amount of caffeine in your body increases and can give you symptoms, such as a rapid heartbeat, sweating, and anxiety.  Avoid smokers because they can make you want to smoke.  Do not let weight gain distract you. Many smokers will gain weight when they quit, usually less than 10 pounds. Eat a healthy diet and stay active. You can always lose the weight gained after you quit.  Find ways to improve your mood other than smoking. FOR MORE INFORMATION  www.smokefree.gov  Document Released: 02/05/2001 Document Revised: 06/28/2013 Document Reviewed: 05/23/2011 [facility] Patient Information 2015 [place], [place]. This information is not intended to replace advice given to you by your health care provider. Make sure you discuss any questions you have with your health care provider.

## 2013-11-03 ENCOUNTER — Emergency Department (HOSPITAL_COMMUNITY)
Admission: EM | Admit: 2013-11-03 | Discharge: 2013-11-03 | Disposition: A | Discharge status: Home / Self Care | Payer: Medicaid Other | Attending: Emergency Medicine | Admitting: Emergency Medicine

## 2013-11-03 ENCOUNTER — Encounter (HOSPITAL_COMMUNITY): Payer: Self-pay | Admitting: Emergency Medicine

## 2013-11-03 ENCOUNTER — Emergency Department (HOSPITAL_COMMUNITY): Payer: Medicaid Other

## 2013-11-03 DIAGNOSIS — F17200 Nicotine dependence, unspecified, uncomplicated: Secondary | ICD-10-CM | POA: Insufficient documentation

## 2013-11-03 DIAGNOSIS — Z8719 Personal history of other diseases of the digestive system: Secondary | ICD-10-CM | POA: Insufficient documentation

## 2013-11-03 DIAGNOSIS — R079 Chest pain, unspecified: Secondary | ICD-10-CM | POA: Insufficient documentation

## 2013-11-03 DIAGNOSIS — Z8709 Personal history of other diseases of the respiratory system: Secondary | ICD-10-CM | POA: Insufficient documentation

## 2013-11-03 HISTORY — DX: Bronchitis, not specified as acute or chronic: J40

## 2013-11-03 LAB — CBC
HEMATOCRIT: 42.1 % (ref 36.0–46.0)
Hemoglobin: 14.7 g/dL (ref 12.0–15.0)
MCH: 28.9 pg (ref 26.0–34.0)
MCHC: 34.9 g/dL (ref 30.0–36.0)
MCV: 82.9 fL (ref 78.0–100.0)
PLATELETS: 280 10*3/uL (ref 150–400)
RBC: 5.08 MIL/uL (ref 3.87–5.11)
RDW: 13.5 % (ref 11.5–15.5)
WBC: 6 10*3/uL (ref 4.0–10.5)

## 2013-11-03 LAB — BASIC METABOLIC PANEL
ANION GAP: 11 (ref 5–15)
BUN: 7 mg/dL (ref 6–23)
CHLORIDE: 102 meq/L (ref 96–112)
CO2: 26 mEq/L (ref 19–32)
Calcium: 9.5 mg/dL (ref 8.4–10.5)
Creatinine, Ser: 0.91 mg/dL (ref 0.50–1.10)
GFR calc non Af Amer: 77 mL/min — ABNORMAL LOW (ref >90)
GFR, EST AFRICAN AMERICAN: 90 mL/min — AB (ref >90)
Glucose, Bld: 95 mg/dL (ref 70–99)
POTASSIUM: 3.9 meq/L (ref 3.7–5.3)
SODIUM: 139 meq/L (ref 137–147)

## 2013-11-03 LAB — TROPONIN I

## 2013-11-03 MED ORDER — NAPROXEN 500 MG PO TABS: 500.0000 mg | ORAL_TABLET | Freq: Two times a day (BID) | ORAL | Status: DC

## 2013-11-03 MED ORDER — PREDNISONE 50 MG PO TABS: ORAL_TABLET | ORAL | Status: DC

## 2013-11-03 NOTE — ED Notes (Signed)
MD at bedside at this time.

## 2013-11-03 NOTE — Discharge Instructions (Signed)
Tests were normal. Medication for pain and inflammation. Followup your primary care Dr.

## 2013-11-03 NOTE — ED Notes (Addendum)
Pt reports chest pain intermittently x2 weeks. Pt reports was seen for same two weeks ago and was diagnosed with bronchitis and given an inhaler. Pt reports no relief and reports pain is worse. Pt denies any n/v but reports cough. nad noted in triage. No dyspnea noted in triage.

## 2013-11-03 NOTE — ED Provider Notes (Signed)
CSN: [id number]     Arrival date & time 11/03/13  1025 History  This chart was scribed for Donnetta Hutching, MD by Leone Payor, ED Scribe. This patient was seen in room APA19/APA19 and the patient's care was started 12:06 PM.    Chief Complaint  Patient presents with  . Chest Pain    The history is provided by the patient. No language interpreter was used.    HPI Comments: LADA FULBRIGHT is a 43 y.o. female who presents to the Emergency Department complaining of 6 months of intermittent left upper and lower chest pain that worsened 2 days ago. She describes the pain as tightness and occasionally sharp. She states the episodes lasts about 5-10 minutes at a time. She was seen for similar symptoms 2 weeks ago but was diagnosed with bronchitis. She states the pain is alleviated with lying on the left side of body. She has not taken any OTC medications for her symptoms. She denies worsened SOB. She states her cousin had an MI at age 83.    Past Medical History  Diagnosis Date  . Acid reflux   . Shortness of breath   . Bronchitis    Past Surgical History  Procedure Laterality Date  . Induced abortion    . Dilation and curettage of uterus    . Tubal ligation  17 yrs ago   Family History  Problem Relation Age of Onset  . Anesthesia problems Neg Hx   . Hypotension Neg Hx   . Malignant hyperthermia Neg Hx   . Pseudochol deficiency Neg Hx    History  Substance Use Topics  . Smoking status: Current Every Day Smoker -- 0.50 packs/day for 21 years    Types: Cigarettes  . Smokeless tobacco: Not on file  . Alcohol Use: Yes     Comment: weekends-drinks 12 pack   OB History   Grav Para Term Preterm Abortions TAB SAB Ect Mult Living   5         3     Review of Systems  A complete 10 system review of systems was obtained and all systems are negative except as noted in the HPI and PMH.    Allergies  Review of patient's allergies indicates no known allergies.  Home Medications   Prior to  Admission medications   Medication Sig Start Date End Date Taking? Authorizing Provider  ibuprofen (ADVIL,MOTRIN) 200 MG tablet Take 400 mg by mouth every 6 (six) hours as needed for moderate pain.   Yes Historical Provider, MD  naproxen (NAPROSYN) 500 MG tablet Take 1 tablet (500 mg total) by mouth 2 (two) times daily. 11/03/13   Donnetta Hutching, MD  predniSONE (DELTASONE) 50 MG tablet 1 tablet daily for 6 days 11/03/13   Donnetta Hutching, MD   BP 137/95  Pulse 82  Temp(Src) 98.7 F (37.1 C) (Oral)  Resp 16  Ht  (1.753 m)  Wt 146 lb (66.225 kg)  BMI 21.55 kg/m2  SpO2 100% Physical Exam  Nursing note and vitals reviewed. Constitutional: She is oriented to person, place, and time. She appears well-developed and well-nourished.  HENT:  Head: Normocephalic and atraumatic.  Eyes: Conjunctivae and EOM are normal. Pupils are equal, round, and reactive to light.  Neck: Normal range of motion. Neck supple.  Cardiovascular: Normal rate, regular rhythm and normal heart sounds.   Pulmonary/Chest: Effort normal and breath sounds normal.  Abdominal: Soft. Bowel sounds are normal.  Musculoskeletal: Normal range of motion.  Neurological:  She is alert and oriented to person, place, and time.  Skin: Skin is warm and dry.  Psychiatric: She has a normal mood and affect. Her behavior is normal.    ED Course  Procedures (including critical care time)  DIAGNOSTIC STUDIES: Oxygen Saturation is 100% on RA, normal by my interpretation.    COORDINATION OF CARE: 12:13 PM Will order CXR and lab work. Discussed treatment plan with pt at bedside and pt agreed to plan.   Labs Review Labs Reviewed  BASIC METABOLIC PANEL - Abnormal; Notable for the following:    GFR calc non Af Amer 77 (*)    GFR calc Af Amer 90 (*)    All other components within normal limits  CBC  TROPONIN I    Imaging Review Dg Chest 2 View  11/03/2013   CLINICAL DATA:  Left-sided chest pain for 2 days  EXAM: CHEST  2 VIEW  COMPARISON:   10/12/2013  FINDINGS: The heart size and mediastinal contours are within normal limits. Both lungs are clear. The visualized skeletal structures are unremarkable.  IMPRESSION: No active cardiopulmonary disease.   Electronically Signed   By: Alcide Clever M.D.   On: 11/03/2013 11:03     EKG Interpretation   Date/Time:  Wednesday November 03 2013 10:26:31 EDT Ventricular Rate:  92 PR Interval:  140 QRS Duration: 90 QT Interval:  376 QTC Calculation: 464 R Axis:   72 Text Interpretation:  Sinus rhythm with marked sinus arrhythmia Possible  Left atrial enlargement Borderline ECG Confirmed by Jarius Dieudonne  MD, Gizel Riedlinger  ([id number]) on 11/03/2013 10:52:46 AM      MDM   Final diagnoses:  Chest pain, unspecified chest pain type    Patient is low risk for acute coronary syndrome or pulmonary embolism. EKG and troponin are negative. Advised to stop smoking. Discharge medications prednisone and Naprosyn 500 mg I personally performed the services described in this documentation, which was scribed in my presence. The recorded information has been reviewed and is accurate.    Donnetta Hutching, MD 11/03/13 1440

## 2013-12-27 ENCOUNTER — Encounter (HOSPITAL_COMMUNITY): Payer: Self-pay | Admitting: Emergency Medicine

## 2015-01-03 ENCOUNTER — Emergency Department (HOSPITAL_COMMUNITY)
Admission: EM | Admit: 2015-01-03 | Discharge: 2015-01-03 | Disposition: A | Discharge status: Home / Self Care | Payer: Medicaid Other | Attending: Emergency Medicine | Admitting: Emergency Medicine

## 2015-01-03 ENCOUNTER — Emergency Department (HOSPITAL_COMMUNITY): Payer: Medicaid Other

## 2015-01-03 ENCOUNTER — Encounter (HOSPITAL_COMMUNITY): Payer: Self-pay | Admitting: *Deleted

## 2015-01-03 DIAGNOSIS — Z8719 Personal history of other diseases of the digestive system: Secondary | ICD-10-CM | POA: Insufficient documentation

## 2015-01-03 DIAGNOSIS — M79672 Pain in left foot: Secondary | ICD-10-CM | POA: Insufficient documentation

## 2015-01-03 DIAGNOSIS — Z720 Tobacco use: Secondary | ICD-10-CM | POA: Insufficient documentation

## 2015-01-03 DIAGNOSIS — Z8709 Personal history of other diseases of the respiratory system: Secondary | ICD-10-CM | POA: Insufficient documentation

## 2015-01-03 DIAGNOSIS — Z7952 Long term (current) use of systemic steroids: Secondary | ICD-10-CM | POA: Insufficient documentation

## 2015-01-03 DIAGNOSIS — Z791 Long term (current) use of non-steroidal anti-inflammatories (NSAID): Secondary | ICD-10-CM | POA: Insufficient documentation

## 2015-01-03 LAB — CBC WITH DIFFERENTIAL/PLATELET
BASOS ABS: 0.1 10*3/uL (ref 0.0–0.1)
Basophils Relative: 1 %
EOS PCT: 3 %
Eosinophils Absolute: 0.2 10*3/uL (ref 0.0–0.7)
HEMATOCRIT: 42.2 % (ref 36.0–46.0)
Hemoglobin: 14.4 g/dL (ref 12.0–15.0)
LYMPHS PCT: 37 %
Lymphs Abs: 2.1 10*3/uL (ref 0.7–4.0)
MCH: 28.1 pg (ref 26.0–34.0)
MCHC: 34.1 g/dL (ref 30.0–36.0)
MCV: 82.4 fL (ref 78.0–100.0)
Monocytes Absolute: 0.4 10*3/uL (ref 0.1–1.0)
Monocytes Relative: 8 %
NEUTROS ABS: 3 10*3/uL (ref 1.7–7.7)
Neutrophils Relative %: 51 %
PLATELETS: 320 10*3/uL (ref 150–400)
RBC: 5.12 MIL/uL — AB (ref 3.87–5.11)
RDW: 13.7 % (ref 11.5–15.5)
WBC: 5.8 10*3/uL (ref 4.0–10.5)

## 2015-01-03 LAB — BASIC METABOLIC PANEL
ANION GAP: 7 (ref 5–15)
BUN: 7 mg/dL (ref 6–20)
CO2: 26 mmol/L (ref 22–32)
Calcium: 9.8 mg/dL (ref 8.9–10.3)
Chloride: 105 mmol/L (ref 101–111)
Creatinine, Ser: 0.91 mg/dL (ref 0.44–1.00)
GFR calc Af Amer: >60 mL/min (ref >60)
GLUCOSE: 93 mg/dL (ref 65–99)
POTASSIUM: 4.2 mmol/L (ref 3.5–5.1)
Sodium: 138 mmol/L (ref 135–145)

## 2015-01-03 LAB — TROPONIN I

## 2015-01-03 MED ORDER — NAPROXEN 500 MG PO TABS: 500.0000 mg | ORAL_TABLET | Freq: Two times a day (BID) | ORAL | Status: DC

## 2015-01-03 NOTE — ED Provider Notes (Signed)
CSN: [id number]     Arrival date & time 01/03/15  1628 History   First MD Initiated Contact with Patient 01/03/15 1854     Chief Complaint  Patient presents with  . Foot Pain     (Consider location/radiation/quality/duration/timing/severity/associated sxs/prior Treatment) HPI Comments: Patient reports intermittent left foot pain since June, which has recently worsened.  No known injury.  No ankle/leg swelling, calf pain, shortness of breath, tachypnea, fast heart rate or palpitations.  Patient is a 44 y.o. female presenting with lower extremity pain. The history is provided by the patient. No language interpreter was used.  Foot Pain This is a recurrent problem. The current episode started 1 to 4 weeks ago. The problem occurs 2 to 4 times per day. The problem has been waxing and waning. Associated symptoms include arthralgias. Pertinent negatives include no coughing, fatigue, fever, joint swelling, myalgias or nausea. The symptoms are aggravated by walking.    Past Medical History  Diagnosis Date  . Acid reflux   . Shortness of breath   . Bronchitis    Past Surgical History  Procedure Laterality Date  . Induced abortion    . Dilation and curettage of uterus    . Tubal ligation  17 yrs ago   Family History  Problem Relation Age of Onset  . Anesthesia problems Neg Hx   . Hypotension Neg Hx   . Malignant hyperthermia Neg Hx   . Pseudochol deficiency Neg Hx    Social History  Substance Use Topics  . Smoking status: Current Every Day Smoker -- 0.50 packs/day for 21 years    Types: Cigarettes  . Smokeless tobacco: None  . Alcohol Use: Yes     Comment: weekends-drinks 12 pack   OB History    Gravida Para Term Preterm AB TAB SAB Ectopic Multiple Living   5         3     Review of Systems  Constitutional: Negative for fever and fatigue.  Respiratory: Negative for cough.   Gastrointestinal: Negative for nausea.  Musculoskeletal: Positive for arthralgias. Negative for  myalgias and joint swelling.  All other systems reviewed and are negative.     Allergies  Review of patient's allergies indicates no known allergies.  Home Medications   Prior to Admission medications   Medication Sig Start Date End Date Taking? Authorizing Provider  ibuprofen (ADVIL,MOTRIN) 200 MG tablet Take 400 mg by mouth every 6 (six) hours as needed for moderate pain.    Historical Provider, MD  naproxen (NAPROSYN) 500 MG tablet Take 1 tablet (500 mg total) by mouth 2 (two) times daily. 11/03/13   Donnetta HutchingBrian Cook, MD  predniSONE (DELTASONE) 50 MG tablet 1 tablet daily for 6 days 11/03/13   Donnetta HutchingBrian Cook, MD   BP 151/91 mmHg  Pulse 88  Temp(Src) 98.6 F (37 C)  Resp 18  Ht 5\' 9"  (1.753 m)  Wt 169 lb 6 oz (76.828 kg)  BMI 25.00 kg/m2  SpO2 99% Physical Exam  Constitutional: She appears well-developed and well-nourished. No distress.  HENT:  Head: Normocephalic.  Eyes: Conjunctivae are normal.  Neck: Neck supple.  Cardiovascular: Normal rate and regular rhythm.   Pulmonary/Chest: Effort normal and breath sounds normal.  Abdominal: Soft. Bowel sounds are normal. She exhibits no distension.  Musculoskeletal: She exhibits tenderness. She exhibits no edema.       Feet:  Dorsal pain of left foot, occasionally radiating up leg. No edema. No plantar pain. No calf pain, negative homan's sign.  Lymphadenopathy:    She has no cervical adenopathy.  Neurological: She is alert.  Skin: Skin is warm and dry.  Psychiatric: She has a normal mood and affect.  Nursing note and vitals reviewed.   ED Course  Procedures (including critical care time) Labs Review Labs Reviewed  CBC WITH DIFFERENTIAL/PLATELET - Abnormal; Notable for the following:    RBC 5.12 (*)    All other components within normal limits  BASIC METABOLIC PANEL  TROPONIN I    Imaging Review Dg Chest 2 View  01/03/2015  CLINICAL DATA:  Left chest pain EXAM: CHEST  2 VIEW COMPARISON:  11/03/2013 chest radiograph. FINDINGS:  Stable cardiomediastinal silhouette with normal heart size. No pneumothorax. No pleural effusion. Stable mild biapical pleural-parenchymal scarring. Otherwise clear lungs, with no pulmonary edema and no focal lung consolidation. IMPRESSION: No active cardiopulmonary disease. Electronically Signed   By: Delbert Phenix M.D.   On: 01/03/2015 17:57   Dg Foot Complete Left  01/03/2015  CLINICAL DATA:  44 year old female with sharp burning pain in the left foot extending up the left leg for the past 4 months. EXAM: LEFT FOOT - COMPLETE 3+ VIEW COMPARISON:  No priors. FINDINGS: Multiple views of the left foot demonstrate no acute displaced fracture, subluxation, dislocation, or soft tissue abnormality. IMPRESSION: No acute radiographic abnormality of the left foot. Electronically Signed   By: Trudie Reed M.D.   On: 01/03/2015 17:58   I have personally reviewed and evaluated these images and lab results as part of my medical decision-making.   EKG Interpretation None     Radiology and lab results reviewed and shared with patient. No acute findings on chest or foot films. No calf tenderness, negative homans sign. No shortness of breath. Not tachycardic or tachypneic. Low probability of DVT/PE. MDM   Final diagnoses:  None    Foot pain. Anti-inflammatory. Follow-up with PCP/orthopedics. Return precautions discussed.    Felicie Morn, NP 01/04/15 0045  Linwood Dibbles, MD 01/04/15 1045

## 2015-01-03 NOTE — ED Notes (Signed)
No answer

## 2015-01-03 NOTE — Discharge Instructions (Signed)

## 2015-01-03 NOTE — ED Notes (Signed)
The pt has had lt foot pain since June and for 2 weeks it has gone up her leg into her lt ribs and into her gereralixed chest.  She was diagnosed with arthritis in June  lmp  One year ago

## 2015-01-03 NOTE — ED Notes (Signed)
NP at the bedside

## 2015-06-28 ENCOUNTER — Other Ambulatory Visit (HOSPITAL_COMMUNITY)
Admission: RE | Admit: 2015-06-28 | Discharge: 2015-06-28 | Disposition: A | Discharge status: Home / Self Care | Payer: Self-pay | Source: Ambulatory Visit | Attending: Advanced Practice Midwife | Admitting: Advanced Practice Midwife

## 2015-06-28 ENCOUNTER — Encounter: Payer: Self-pay | Admitting: Advanced Practice Midwife

## 2015-06-28 ENCOUNTER — Ambulatory Visit (INDEPENDENT_AMBULATORY_CARE_PROVIDER_SITE_OTHER): Payer: Medicaid Other | Admitting: Advanced Practice Midwife

## 2015-06-28 VITALS — BP 128/90 | HR 80 | Ht 69.0 in | Wt 166.0 lb

## 2015-06-28 DIAGNOSIS — Z3049 Encounter for surveillance of other contraceptives: Secondary | ICD-10-CM | POA: Diagnosis not present

## 2015-06-28 DIAGNOSIS — Z1151 Encounter for screening for human papillomavirus (HPV): Secondary | ICD-10-CM | POA: Insufficient documentation

## 2015-06-28 DIAGNOSIS — Z01419 Encounter for gynecological examination (general) (routine) without abnormal findings: Secondary | ICD-10-CM

## 2015-06-28 DIAGNOSIS — Z01411 Encounter for gynecological examination (general) (routine) with abnormal findings: Secondary | ICD-10-CM | POA: Insufficient documentation

## 2015-06-28 NOTE — Progress Notes (Signed)
Elizabeth Quinn Shiller 45 y.o.  Filed Vitals:   06/28/15 1111  BP: 128/90  Pulse: 80     Filed Weights   06/28/15 1111  Weight: 166 lb (75.297 kg)    Past Medical History: Past Medical History  Diagnosis Date  . Acid reflux   . Shortness of breath   . Bronchitis     Past Surgical History: Past Surgical History  Procedure Laterality Date  . Induced abortion    . Dilation and curettage of uterus    . Tubal ligation  17 yrs ago    Family History: Family History  Problem Relation Age of Onset  . Anesthesia problems Neg Hx   . Hypotension Neg Hx   . Malignant hyperthermia Neg Hx   . Pseudochol deficiency Neg Hx   . Cancer Paternal Grandfather   . Stroke Maternal Grandfather   . Diabetes Mother   . Hypertension Mother     Social History: Social History  Substance Use Topics  . Smoking status: Current Every Day Smoker -- 0.50 packs/day for 21 years    Types: Cigarettes  . Smokeless tobacco: None  . Alcohol Use: Yes     Comment: weekends-drinks 12 pack    Allergies: No Known Allergies    Current outpatient prescriptions:  .  naproxen (NAPROSYN) 500 MG tablet, Take 1 tablet (500 mg total) by mouth 2 (two) times daily. (Patient not taking: Reported on 06/28/2015), Disp: 20 tablet, Rfl: 0 .  predniSONE (DELTASONE) 50 MG tablet, 1 tablet daily for 6 days (Patient not taking: Reported on 01/03/2015), Disp: 6 tablet, Rfl: 0  History of Present Illness: here for pap/physical.  Last pap 2-3 years ago had HD>  Has never had an abnormal one.  [facility]    Review of Systems   Patient denies any headaches, blurred vision, shortness of breath, chest pain, abdominal pain, problems with bowel movements, urination, or intercourse.   Physical Exam: General:  Well developed, well nourished, no acute distress Skin:  Warm and dry Neck:  Midline trachea, normal thyroid Lungs; Clear to auscultation bilaterally Breast:  No dominant palpable mass, retraction, or nipple  discharge Cardiovascular: Regular rate and rhythm Abdomen:  Soft, non tender, no hepatosplenomegaly Pelvic:  External genitalia is normal in appearance.  The vagina is normal in appearance.  The cervix is bulbous.  Uterus is felt to be normal size, shape, and contour.  No adnexal masses or tenderness noted.  Extremities:  No swelling or varicosities noted Psych:  No mood changes.     Impression: normal exam     Plan: if negative, pap q 3 years; yearly mammograms (ask about grant for people w/o insurance, phone # given)

## 2015-06-28 NOTE — Patient Instructions (Signed)
Call [phone] to schedule mammogram/ask about billing

## 2015-06-30 LAB — CYTOLOGY - PAP

## 2015-07-01 ENCOUNTER — Emergency Department (HOSPITAL_COMMUNITY)
Admission: EM | Admit: 2015-07-01 | Discharge: 2015-07-01 | Disposition: A | Discharge status: Home / Self Care | Payer: Self-pay | Attending: Emergency Medicine | Admitting: Emergency Medicine

## 2015-07-01 ENCOUNTER — Encounter (HOSPITAL_COMMUNITY): Payer: Self-pay | Admitting: Emergency Medicine

## 2015-07-01 DIAGNOSIS — J0120 Acute ethmoidal sinusitis, unspecified: Secondary | ICD-10-CM | POA: Insufficient documentation

## 2015-07-01 DIAGNOSIS — Z79899 Other long term (current) drug therapy: Secondary | ICD-10-CM | POA: Insufficient documentation

## 2015-07-01 DIAGNOSIS — F17210 Nicotine dependence, cigarettes, uncomplicated: Secondary | ICD-10-CM | POA: Insufficient documentation

## 2015-07-01 MED ORDER — HYDROCODONE-ACETAMINOPHEN 5-325 MG PO TABS: 2.0000 | ORAL_TABLET | ORAL | Status: DC | PRN

## 2015-07-01 MED ORDER — AMOXICILLIN-POT CLAVULANATE 875-125 MG PO TABS: 1.0000 | ORAL_TABLET | Freq: Two times a day (BID) | ORAL | Status: DC

## 2015-07-01 MED ORDER — AMOXICILLIN 500 MG PO CAPS: 500.0000 mg | ORAL_CAPSULE | Freq: Three times a day (TID) | ORAL | Status: DC

## 2015-07-01 NOTE — ED Notes (Signed)
Pt reports L sided discomfort in her face, thinks she has sinus problem. Pt has productive cough with thick white sputum.

## 2015-07-01 NOTE — Discharge Instructions (Signed)

## 2015-07-01 NOTE — ED Notes (Signed)
Instructed pt to take all of antibiotics as prescribed.Pt verbalized understanding of no driving and to use caution within 4 hours of taking pain meds due to meds cause drowsiness 

## 2015-07-01 NOTE — ED Notes (Signed)
Pt with pain to left eye area, states just recently has light sensitivity for past few hours, took a nasal decongestant at home

## 2015-07-01 NOTE — ED Provider Notes (Signed)
CSN: [id number]     Arrival date & time 07/01/15  [id number] History   First MD Initiated Contact with Patient 07/01/15 1406     Chief Complaint  Patient presents with  . Facial Pain     (Consider location/radiation/quality/duration/timing/severity/associated sxs/prior Treatment) Patient is a 45 y.o. female presenting with URI. The history is provided by the patient. No language interpreter was used.  URI Presenting symptoms: rhinorrhea and sore throat   Severity:  Severe Onset quality:  Gradual Timing:  Constant Progression:  Worsening Chronicity:  New Relieved by:  Nothing Worsened by:  Nothing tried Ineffective treatments:  None tried Associated symptoms: sinus pain   Risk factors: no recent illness   Pt complains of pain to forehead and around left eye.  Pt complains of odorous foul tasting sinus drainage.   Past Medical History  Diagnosis Date  . Acid reflux   . Shortness of breath   . Bronchitis    Past Surgical History  Procedure Laterality Date  . Induced abortion    . Dilation and curettage of uterus    . Tubal ligation  17 yrs ago   Family History  Problem Relation Age of Onset  . Anesthesia problems Neg Hx   . Hypotension Neg Hx   . Malignant hyperthermia Neg Hx   . Pseudochol deficiency Neg Hx   . Cancer Paternal Grandfather   . Stroke Maternal Grandfather   . Diabetes Mother   . Hypertension Mother    Social History  Substance Use Topics  . Smoking status: Current Every Day Smoker -- 0.50 packs/day for 21 years    Types: Cigarettes  . Smokeless tobacco: None  . Alcohol Use: Yes     Comment: weekends-drinks 12 pack   OB History    Gravida Para Term Preterm AB TAB SAB Ectopic Multiple Living   5         3     Review of Systems  HENT: Positive for rhinorrhea and sore throat.   All other systems reviewed and are negative.     Allergies  Review of patient's allergies indicates no known allergies.  Home Medications   Prior to Admission  medications   Medication Sig Start Date End Date Taking? Authorizing Provider  Pseudoephedrine HCl (DECONGESTANT PO) Take 1 tablet by mouth every 4 (four) hours as needed (nasal decongestion.). [employer] brand.  Small round, red tablet.   Yes Historical Provider, MD  naproxen (NAPROSYN) 500 MG tablet Take 1 tablet (500 mg total) by mouth 2 (two) times daily. Patient not taking: Reported on 06/28/2015 01/03/15   Felicie Mornavid Smith, NP  predniSONE (DELTASONE) 50 MG tablet 1 tablet daily for 6 days Patient not taking: Reported on 01/03/2015 11/03/13   Donnetta HutchingBrian Cook, MD   BP 136/100 mmHg  Pulse 82  Temp(Src) 97.8 F (36.6 C) (Temporal)  Resp 18  Ht 5\' 9"  (1.753 m)  Wt 77.111 kg  BMI 25.09 kg/m2  SpO2 100% Physical Exam  Constitutional: She is oriented to person, place, and time. She appears well-developed and well-nourished.  HENT:  Head: Normocephalic and atraumatic.  Right Ear: External ear normal.  Left Ear: External ear normal.  Nose: Nose normal.  Mouth/Throat: Oropharynx is clear and moist.  Tender frontal sinuses.   Eyes: Conjunctivae and EOM are normal. Pupils are equal, round, and reactive to light.  Neck: Normal range of motion.  Cardiovascular: Normal rate.   Pulmonary/Chest: Effort normal and breath sounds normal.  Abdominal: She exhibits no distension.  Musculoskeletal: Normal range of motion.  Neurological: She is alert and oriented to person, place, and time.  Skin: Skin is warm.  Psychiatric: She has a normal mood and affect.  Nursing note and vitals reviewed.   ED Course  Procedures (including critical care time) Labs Review Labs Reviewed - No data to display  Imaging Review No results found. I have personally reviewed and evaluated these images and lab results as part of my medical decision-making.   EKG Interpretation None      MDM    Final diagnoses:  Acute ethmoidal sinusitis, recurrence not specified    Meds ordered this encounter  Medications  .  Pseudoephedrine HCl (DECONGESTANT PO)    Sig: Take 1 tablet by mouth every 4 (four) hours as needed (nasal decongestion.). [employer] brand.  Small round, red tablet.  Marland Kitchen amoxicillin-clavulanate (AUGMENTIN) 875-125 MG tablet    Sig: Take 1 tablet by mouth every 12 (twelve) hours.    Dispense:  20 tablet    Refill:  0    Order Specific Question:  Supervising Provider    Answer:  MILLER, BRIAN [[id number]]  . HYDROcodone-acetaminophen (NORCO/VICODIN) 5-325 MG tablet    Sig: Take 2 tablets by mouth every 4 (four) hours as needed.    Dispense:  10 tablet    Refill:  0    Order Specific Question:  Supervising Provider    Answer:  Eber Hong [[id number]]  An After Visit Summary was printed and given to the patient.    Lonia Skinner [place], PA-C 07/01/15 1437  Loren Racer, MD 07/05/15 [phone]

## 2015-08-25 ENCOUNTER — Emergency Department (HOSPITAL_COMMUNITY)
Admission: EM | Admit: 2015-08-25 | Discharge: 2015-08-25 | Disposition: A | Discharge status: Home / Self Care | Payer: Medicaid Other | Attending: Emergency Medicine | Admitting: Emergency Medicine

## 2015-08-25 ENCOUNTER — Encounter (HOSPITAL_COMMUNITY): Payer: Self-pay | Admitting: Emergency Medicine

## 2015-08-25 DIAGNOSIS — I10 Essential (primary) hypertension: Secondary | ICD-10-CM

## 2015-08-25 DIAGNOSIS — F17210 Nicotine dependence, cigarettes, uncomplicated: Secondary | ICD-10-CM | POA: Insufficient documentation

## 2015-08-25 DIAGNOSIS — R55 Syncope and collapse: Secondary | ICD-10-CM

## 2015-08-25 LAB — CBC WITH DIFFERENTIAL/PLATELET
Basophils Absolute: 0 10*3/uL (ref 0.0–0.1)
Basophils Relative: 0 %
EOS PCT: 3 %
Eosinophils Absolute: 0.2 10*3/uL (ref 0.0–0.7)
HCT: 42.8 % (ref 36.0–46.0)
Hemoglobin: 14.4 g/dL (ref 12.0–15.0)
LYMPHS PCT: 22 %
Lymphs Abs: 1.6 10*3/uL (ref 0.7–4.0)
MCH: 27.9 pg (ref 26.0–34.0)
MCHC: 33.6 g/dL (ref 30.0–36.0)
MCV: 82.8 fL (ref 78.0–100.0)
MONO ABS: 0.6 10*3/uL (ref 0.1–1.0)
MONOS PCT: 8 %
NEUTROS ABS: 4.9 10*3/uL (ref 1.7–7.7)
Neutrophils Relative %: 67 %
Platelets: 314 10*3/uL (ref 150–400)
RBC: 5.17 MIL/uL — ABNORMAL HIGH (ref 3.87–5.11)
RDW: 13.9 % (ref 11.5–15.5)
WBC: 7.3 10*3/uL (ref 4.0–10.5)

## 2015-08-25 LAB — BASIC METABOLIC PANEL
Anion gap: 5 (ref 5–15)
BUN: 11 mg/dL (ref 6–20)
CALCIUM: 9.5 mg/dL (ref 8.9–10.3)
CO2: 27 mmol/L (ref 22–32)
Chloride: 108 mmol/L (ref 101–111)
Creatinine, Ser: 0.82 mg/dL (ref 0.44–1.00)
GFR calc Af Amer: >60 mL/min (ref >60)
GFR calc non Af Amer: >60 mL/min (ref >60)
GLUCOSE: 91 mg/dL (ref 65–99)
Potassium: 3.9 mmol/L (ref 3.5–5.1)
Sodium: 140 mmol/L (ref 135–145)

## 2015-08-25 LAB — CBG MONITORING, ED: Glucose-Capillary: 89 mg/dL (ref 65–99)

## 2015-08-25 MED ORDER — SODIUM CHLORIDE 0.9 % IV BOLUS (SEPSIS)
1000.0000 mL | Freq: Once | INTRAVENOUS | Status: AC
  Administered 2015-08-25: 1000 mL via INTRAVENOUS

## 2015-08-25 MED ORDER — SODIUM CHLORIDE 0.9 % IV SOLN
INTRAVENOUS | Status: DC
Start: 2015-08-25 — End: 2015-08-25
  Administered 2015-08-25: 18:00:00 via INTRAVENOUS

## 2015-08-25 NOTE — ED Provider Notes (Signed)
CSN: [id number]     Arrival date & time 08/25/15  1545 History   First MD Initiated Contact with Patient 08/25/15 1631     Chief Complaint  Patient presents with  . Hypertension     (Consider location/radiation/quality/duration/timing/severity/associated sxs/prior Treatment) Patient is a 45 y.o. female presenting with hypertension. The history is provided by the patient.  Hypertension Pertinent negatives include no abdominal pain.   Patient with acute feeling of dizziness lightheadedness and blurring of the vision 1 hour prior to arrival. Patient thought she was passed out. Not associated with chest pain shortness of breath or abdominal pain nausea vomiting or diarrhea. No dysuria. Patient felt fine earlier in the day. Patient is followed by the health department. Patient does not have a history of Apert tension. Blood pressures were high with today's incidents. Past Medical History  Diagnosis Date  . Acid reflux   . Shortness of breath   . Bronchitis    Past Surgical History  Procedure Laterality Date  . Induced abortion    . Dilation and curettage of uterus    . Tubal ligation  17 yrs ago   Family History  Problem Relation Age of Onset  . Anesthesia problems Neg Hx   . Hypotension Neg Hx   . Malignant hyperthermia Neg Hx   . Pseudochol deficiency Neg Hx   . Cancer Paternal Grandfather   . Stroke Maternal Grandfather   . Diabetes Mother   . Hypertension Mother    Social History  Substance Use Topics  . Smoking status: Current Every Day Smoker -- 0.50 packs/day for 21 years    Types: Cigarettes  . Smokeless tobacco: None  . Alcohol Use: Yes     Comment: weekends-drinks 12 pack   OB History    Gravida Para Term Preterm AB TAB SAB Ectopic Multiple Living   5         3     Review of Systems  Constitutional: Positive for fatigue. Negative for fever.  HENT: Negative for congestion.   Eyes: Positive for visual disturbance.  Respiratory: Negative for wheezing.    Cardiovascular: Negative for palpitations.  Gastrointestinal: Negative for nausea, vomiting and abdominal pain.  Genitourinary: Negative for dysuria.  Musculoskeletal: Negative for back pain and neck pain.  Neurological: Positive for dizziness, tremors and light-headedness.  Hematological: Does not bruise/bleed easily.  Psychiatric/Behavioral: Negative for confusion.      Allergies  Review of patient's allergies indicates no known allergies.  Home Medications   Prior to Admission medications   Not on File   BP 142/78 mmHg  Pulse 76  Temp(Src) 97.5 F (36.4 C) (Oral)  Resp 16  Ht 5\' 9"  (1.753 m)  Wt 79.379 kg  BMI 25.83 kg/m2  SpO2 100% Physical Exam  Constitutional: She is oriented to person, place, and time. She appears well-developed and well-nourished. No distress.  HENT:  Head: Normocephalic and atraumatic.  Mouth/Throat: Oropharynx is clear and moist.  Eyes: Conjunctivae and EOM are normal. Pupils are equal, round, and reactive to light.  Neck: Normal range of motion. Neck supple.  Cardiovascular: Normal rate, regular rhythm and normal heart sounds.   No murmur heard. Pulmonary/Chest: Effort normal and breath sounds normal. No respiratory distress.  Abdominal: Bowel sounds are normal.  Musculoskeletal: Normal range of motion. She exhibits no edema.  Neurological: She is alert and oriented to person, place, and time. No cranial nerve deficit. She exhibits normal muscle tone. Coordination normal.  Skin: Skin is warm. No rash noted.  Nursing note and vitals reviewed.   ED Course  Procedures (including critical care time) Labs Review Labs Reviewed  CBC WITH DIFFERENTIAL/PLATELET - Abnormal; Notable for the following:    RBC 5.17 (*)    All other components within normal limits  BASIC METABOLIC PANEL  CBG MONITORING, ED   Results for orders placed or performed during the hospital encounter of 08/25/15  CBC with Differential/Platelet  Result Value Ref Range    WBC 7.3 4.0 - 10.5 K/uL   RBC 5.17 (H) 3.87 - 5.11 MIL/uL   Hemoglobin 14.4 12.0 - 15.0 g/dL   HCT 16.142.8 09.636.0 - 04.546.0 %   MCV 82.8 78.0 - 100.0 fL   MCH 27.9 26.0 - 34.0 pg   MCHC 33.6 30.0 - 36.0 g/dL   RDW 40.913.9 81.111.5 - 91.415.5 %   Platelets 314 150 - 400 K/uL   Neutrophils Relative % 67 %   Neutro Abs 4.9 1.7 - 7.7 K/uL   Lymphocytes Relative 22 %   Lymphs Abs 1.6 0.7 - 4.0 K/uL   Monocytes Relative 8 %   Monocytes Absolute 0.6 0.1 - 1.0 K/uL   Eosinophils Relative 3 %   Eosinophils Absolute 0.2 0.0 - 0.7 K/uL   Basophils Relative 0 %   Basophils Absolute 0.0 0.0 - 0.1 K/uL  Basic metabolic panel  Result Value Ref Range   Sodium 140 135 - 145 mmol/L   Potassium 3.9 3.5 - 5.1 mmol/L   Chloride 108 101 - 111 mmol/L   CO2 27 22 - 32 mmol/L   Glucose, Bld 91 65 - 99 mg/dL   BUN 11 6 - 20 mg/dL   Creatinine, Ser 7.820.82 0.44 - 1.00 mg/dL   Calcium 9.5 8.9 - 95.610.3 mg/dL   GFR calc non Af Amer >60 >60 mL/min   GFR calc Af Amer >60 >60 mL/min   Anion gap 5 5 - 15  CBG monitoring, ED  Result Value Ref Range   Glucose-Capillary 89 65 - 99 mg/dL    Imaging Review No results found. I have personally reviewed and evaluated these images and lab results as part of my medical decision-making.   EKG Interpretation   Date/Time:  Friday August 25 2015 17:08:57 EDT Ventricular Rate:  66 PR Interval:    QRS Duration: 93 QT Interval:  411 QTC Calculation: 431 R Axis:   77 Text Interpretation:  Sinus rhythm Confirmed by Geraldin Habermehl  MD, Kjersti Dittmer  ([id number]) on 08/25/2015 5:19:24 PM      MDM   Final diagnoses:  Near syncope  Essential hypertension    Patient feels much better after fluid hydration. Labs without any significant abnormality. EKG without any acute findings. Blood pressure improved to 142/78. Patient no longer feeling dizzy or feeling like she's got pass out. Patient did not have any vertigo. No hypotension. No tachycardia. Patient stable for discharge home and follow-up of blood  pressure at health department.    Vanetta MuldersScott Chaslyn Eisen, MD 08/25/15 [phone]

## 2015-08-25 NOTE — Discharge Instructions (Signed)
Recommend follow-up for your blood pressure at the health department. Return for any new or worse symptoms. Today's workup without any acute findings.

## 2015-08-25 NOTE — ED Notes (Addendum)
Pt has visual fine trimmer.  Pt here for BP check.  History history of HTN.  Denies any pain.

## 2015-11-17 ENCOUNTER — Encounter (HOSPITAL_COMMUNITY): Payer: Self-pay | Admitting: *Deleted

## 2015-11-17 ENCOUNTER — Emergency Department (HOSPITAL_COMMUNITY)
Admission: EM | Admit: 2015-11-17 | Discharge: 2015-11-17 | Disposition: A | Discharge status: Home / Self Care | Payer: Medicaid Other | Attending: Emergency Medicine | Admitting: Emergency Medicine

## 2015-11-17 DIAGNOSIS — F17210 Nicotine dependence, cigarettes, uncomplicated: Secondary | ICD-10-CM | POA: Insufficient documentation

## 2015-11-17 DIAGNOSIS — M1712 Unilateral primary osteoarthritis, left knee: Secondary | ICD-10-CM | POA: Insufficient documentation

## 2015-11-17 DIAGNOSIS — M1711 Unilateral primary osteoarthritis, right knee: Secondary | ICD-10-CM | POA: Insufficient documentation

## 2015-11-17 DIAGNOSIS — M170 Bilateral primary osteoarthritis of knee: Secondary | ICD-10-CM

## 2015-11-17 MED ORDER — NAPROXEN 500 MG PO TABS: 500.0000 mg | ORAL_TABLET | Freq: Two times a day (BID) | ORAL | 0 refills | Status: DC

## 2015-11-17 NOTE — ED Provider Notes (Signed)
[facility] Provider Note   CSN: [id number] Arrival date & time: 11/17/15  1803     History   Chief Complaint Chief Complaint  Patient presents with  . Knee Pain    HPI Elizabeth Quinn is a 45 y.o. female.  Elizabeth Quinn is a 45 y.o. Female who presents to the emergency department complaining of bilateral knee pain since last night. Patient reports she's been diagnosed with osteoarthritis previously and reports this feels the same. She reports she occasionally has some pain in her knees but it seems to be worse since yesterday. She denies any injury or trauma to her knees. She reports she usually takes some Aleve but she has not taken any recently. No treatments attempted prior to arrival. She denies fevers, calf pain, calf swelling, falls, numbness, tingling, weakness or rashes.   The history is provided by the patient. No language interpreter was used.  Knee Pain   Pertinent negatives include no numbness.    Past Medical History:  Diagnosis Date  . Acid reflux   . Bronchitis   . Shortness of breath     There are no active problems to display for this patient.   Past Surgical History:  Procedure Laterality Date  . ABLATION    . DILATION AND CURETTAGE OF UTERUS    . INDUCED ABORTION    . TUBAL LIGATION  17 yrs ago    OB History    Gravida Para Term Preterm AB Living   5         3   SAB TAB Ectopic Multiple Live Births                   Home Medications    Prior to Admission medications   Medication Sig Start Date End Date Taking? Authorizing Provider  naproxen (NAPROSYN) 500 MG tablet Take 1 tablet (500 mg total) by mouth 2 (two) times daily with a meal. 11/17/15   Everlene Farrier, PA-C    Family History Family History  Problem Relation Age of Onset  . Cancer Paternal Grandfather   . Stroke Maternal Grandfather   . Diabetes Mother   . Hypertension Mother   . Anesthesia problems Neg Hx   . Hypotension Neg Hx   . Malignant hyperthermia Neg Hx    . Pseudochol deficiency Neg Hx     Social History Social History  Substance Use Topics  . Smoking status: Current Every Day Smoker    Packs/day: 0.50    Years: 21.00    Types: Cigarettes  . Smokeless tobacco: Never Used  . Alcohol use Yes     Comment: weekends-drinks 12 pack     Allergies   Review of patient's allergies indicates no known allergies.   Review of Systems Review of Systems  Constitutional: Negative for fever.  Cardiovascular: Negative for leg swelling.  Musculoskeletal: Positive for arthralgias. Negative for joint swelling.  Skin: Negative for rash and wound.  Neurological: Negative for weakness and numbness.     Physical Exam Updated Vital Signs BP 134/79 (BP Location: Left Arm)   Pulse 81   Temp 98.4 F (36.9 C) (Oral)   Resp 18   Ht 5\' 9"  (1.753 m)   Wt 72.6 kg   SpO2 100%   BMI 23.63 kg/m   Physical Exam  Constitutional: She appears well-developed and well-nourished. No distress.  HENT:  Head: Normocephalic and atraumatic.  Eyes: Right eye exhibits no discharge. Left eye exhibits no discharge.  Cardiovascular: Normal  rate, regular rhythm and intact distal pulses.   Bilateral dorsalis pedis and posterior tibialis pulses are intact.  Pulmonary/Chest: Effort normal. No respiratory distress.  Musculoskeletal: Normal range of motion. She exhibits no deformity.  No knee edema, erythema, ecchymosis or warmth. No calf edema or tenderness. Patient is able to ambulate with normal gait in the room. No knee deformity noted bilaterally.  Neurological: She is alert. Coordination normal.  Sensation is intact her bilateral lower extremities. Normal gait.  Skin: Skin is warm and dry. Capillary refill takes less than 2 seconds. No rash noted. She is not diaphoretic. No erythema. No pallor.  Psychiatric: She has a normal mood and affect. Her behavior is normal.  Nursing note and vitals reviewed.    ED Treatments / Results  Labs (all labs ordered are  listed, but only abnormal results are displayed) Labs Reviewed - No data to display  EKG  EKG Interpretation None       Radiology No results found.  Procedures Procedures (including critical care time)  Medications Ordered in ED Medications - No data to display   Initial Impression / Assessment and Plan / ED Course  I have reviewed the triage vital signs and the nursing notes.  Pertinent labs & imaging results that were available during my care of the patient were reviewed by me and considered in my medical decision making (see chart for details).  Clinical Course   This is a 45 y.o. Female who presents to the emergency department complaining of bilateral knee pain since last night. Patient reports she's been diagnosed with osteoarthritis previously and reports this feels the same. She reports she occasionally has some pain in her knees but it seems to be worse since yesterday. She denies any injury or trauma to her knees. She reports she usually takes some Aleve but she has not taken any recently.  I examined the patient is afebrile nontoxic appearing. She has no knee erythema, edema, ecchymosis or warmth. She reports it feels that she is walking on their bones when she ambulates. She is able to ambulate with normal gait. No calf edema or tenderness. I see no need for x-ray at this time as the patient has had no injury or trauma. She is also able to ambulate. She also has a history of osteoarthritis. Will start her on a course of naproxen 500 mg. I encouraged her to use ice. She also requests knee sleeves bilaterally. Will provide her with Ace bandages bilaterally as we do not have any knee sleeves in the emergency department at this time. I encouraged her to follow-up with her primary care doctor and gave information for orthopedic surgery follow up. I advised the patient to follow-up with their primary care provider this week. I advised the patient to return to the emergency department  with new or worsening symptoms or new concerns. The patient verbalized understanding and agreement with plan.    Final Clinical Impressions(s) / ED Diagnoses   Final diagnoses:  Osteoarthritis of both knees, unspecified osteoarthritis type    New Prescriptions New Prescriptions   NAPROXEN (NAPROSYN) 500 MG TABLET    Take 1 tablet (500 mg total) by mouth 2 (two) times daily with a meal.     Everlene FarrierWilliam Lorette Peterkin, PA-C 11/17/15 1924    Bethann BerkshireJoseph Zammit, MD 11/20/15 2004

## 2015-11-17 NOTE — ED Triage Notes (Signed)
Pt comes in with bilateral knee pain starting last night. Pt denies any injury. Hx of arthritis and states this feels the same.

## 2016-03-09 ENCOUNTER — Encounter (HOSPITAL_COMMUNITY): Payer: Self-pay | Admitting: Emergency Medicine

## 2016-03-09 ENCOUNTER — Emergency Department (HOSPITAL_COMMUNITY)
Admission: EM | Admit: 2016-03-09 | Discharge: 2016-03-09 | Disposition: A | Discharge status: Home / Self Care | Payer: Medicaid Other | Attending: Emergency Medicine | Admitting: Emergency Medicine

## 2016-03-09 DIAGNOSIS — Y929 Unspecified place or not applicable: Secondary | ICD-10-CM | POA: Insufficient documentation

## 2016-03-09 DIAGNOSIS — F17210 Nicotine dependence, cigarettes, uncomplicated: Secondary | ICD-10-CM | POA: Insufficient documentation

## 2016-03-09 DIAGNOSIS — X501XXA Overexertion from prolonged static or awkward postures, initial encounter: Secondary | ICD-10-CM | POA: Insufficient documentation

## 2016-03-09 DIAGNOSIS — Y999 Unspecified external cause status: Secondary | ICD-10-CM | POA: Insufficient documentation

## 2016-03-09 DIAGNOSIS — S46812A Strain of other muscles, fascia and tendons at shoulder and upper arm level, left arm, initial encounter: Secondary | ICD-10-CM | POA: Insufficient documentation

## 2016-03-09 DIAGNOSIS — Y9389 Activity, other specified: Secondary | ICD-10-CM | POA: Insufficient documentation

## 2016-03-09 MED ORDER — KETOROLAC TROMETHAMINE 60 MG/2ML IM SOLN
60.0000 mg | Freq: Once | INTRAMUSCULAR | Status: AC
  Administered 2016-03-09: 60 mg via INTRAMUSCULAR
  Filled 2016-03-09: qty 2

## 2016-03-09 MED ORDER — CYCLOBENZAPRINE HCL 5 MG PO TABS: 5.0000 mg | ORAL_TABLET | Freq: Three times a day (TID) | ORAL | 0 refills | Status: DC | PRN

## 2016-03-09 MED ORDER — NAPROXEN 500 MG PO TABS: ORAL_TABLET | ORAL | 0 refills | Status: DC

## 2016-03-09 NOTE — Discharge Instructions (Signed)
Use ice and heat for comfort. Take the medications as prescribed. Recheck at your doctor if not improving over the next week.

## 2016-03-09 NOTE — ED Provider Notes (Signed)
[facility] Provider Note   CSN: [id number] Arrival date & time: 03/09/16  0420  Time seen 04:45 AM   History   Chief Complaint Chief Complaint  Patient presents with  . Torticollis  . Shoulder Pain    HPI Elizabeth Quinn is a 46 y.o. female.  HPI patient reports she has had pain in the left side of her neck for the past month. She states she feels like her neck swells when she lays down. She states actually moving her head makes it pain feel better. Patient is noted to be sitting with her head tilted to the right which she states makes it more comfortable. She denies any fever or difficulty swallowing or feeling short of breath. She thinks her neck is swollen and her significant other states sometimes it looks a little puffy. She denies any difficulty swallowing or breathing. She denies fever. She denies any numbness in her extremities. She states the pain does start in the lateral neck and extends towards her left shoulder. She states movement of her head doesn't make it worse although she states holding her head tilted to the right seems to make it feel better.  PCP MUSE,ROCHELLE D., PA-C   Past Medical History:  Diagnosis Date  . Acid reflux   . Bronchitis   . Shortness of breath     There are no active problems to display for this patient.   Past Surgical History:  Procedure Laterality Date  . ABLATION    . DILATION AND CURETTAGE OF UTERUS    . INDUCED ABORTION    . TUBAL LIGATION  17 yrs ago    OB History    Gravida Para Term Preterm AB Living   5         3   SAB TAB Ectopic Multiple Live Births                   Home Medications    Prior to Admission medications   Medication Sig Start Date End Date Taking? Authorizing Provider  cyclobenzaprine (FLEXERIL) 5 MG tablet Take 1 tablet (5 mg total) by mouth 3 (three) times daily as needed. 03/09/16   Devoria Albe, MD  naproxen (NAPROSYN) 500 MG tablet Take 1 po BID with food prn pain 03/09/16   Devoria Albe,  MD    Family History Family History  Problem Relation Age of Onset  . Cancer Paternal Grandfather   . Stroke Maternal Grandfather   . Diabetes Mother   . Hypertension Mother   . Anesthesia problems Neg Hx   . Hypotension Neg Hx   . Malignant hyperthermia Neg Hx   . Pseudochol deficiency Neg Hx     Social History Social History  Substance Use Topics  . Smoking status: Current Every Day Smoker    Packs/day: 1.00    Years: 21.00    Types: Cigarettes  . Smokeless tobacco: Never Used  . Alcohol use Yes     Comment: weekends-drinks 12 pack  employed Drinks 1 beer daily Smokes 1 1/2 ppd   Allergies   Patient has no known allergies.   Review of Systems Review of Systems  All other systems reviewed and are negative.    Physical Exam Updated Vital Signs BP (!) 150/104 (BP Location: Right Arm)   Pulse 87   Temp 97.5 F (36.4 C) (Oral)   Resp 14   Ht 5\' 9"  (1.753 m)   Wt 170 lb (77.1 kg)   SpO2 100%  BMI 25.10 kg/m   Vital signs normal except hypertension   Physical Exam  Constitutional: She is oriented to person, place, and time. She appears well-developed and well-nourished.  Non-toxic appearance. She does not appear ill. No distress.  HENT:  Head: Normocephalic and atraumatic.  Right Ear: External ear normal.  Left Ear: External ear normal.  Nose: Nose normal. No mucosal edema or rhinorrhea.  Mouth/Throat: Oropharynx is clear and moist and mucous membranes are normal. No dental abscesses or uvula swelling.  Eyes: Conjunctivae and EOM are normal. Pupils are equal, round, and reactive to light.  Neck: Normal range of motion and full passive range of motion without pain. Neck supple.  Patient is noted to have some small bilateral posterior cervical lymphadenopathy. She may have some very small supraclavicular lymphadenopathy but it's not clear. She's very tender to the lateral left cervical spine and along the course of the trapezius muscle. We do range of  motion of her arm and her neck this causes discomfort.  Cardiovascular: Normal rate, regular rhythm and normal heart sounds.  Exam reveals no gallop and no friction rub.   No murmur heard. Pulmonary/Chest: Effort normal and breath sounds normal. No respiratory distress. She has no wheezes. She has no rhonchi. She has no rales. She exhibits no tenderness and no crepitus.  Abdominal: Normal appearance.  Musculoskeletal: Normal range of motion. She exhibits no edema or tenderness.  Moves all extremities well.   Neurological: She is alert and oriented to person, place, and time. She has normal strength. No cranial nerve deficit.  Skin: Skin is warm, dry and intact. No rash noted. No erythema. No pallor.  Psychiatric: She has a normal mood and affect. Her speech is normal and behavior is normal. Her mood appears not anxious.  Nursing note and vitals reviewed.    ED Treatments / Results   Procedures Procedures (including critical care time)  Medications Ordered in ED Medications  ketorolac (TORADOL) injection 60 mg (not administered)     Initial Impression / Assessment and Plan / ED Course  I have reviewed the triage vital signs and the nursing notes.  Pertinent labs & imaging results that were available during my care of the patient were reviewed by me and considered in my medical decision making (see chart for details).  Clinical Course    Patient was given Toradol IM. We discussed using ice and heat for comfort. She will be discharged on anti-inflammatory muscle relaxer. I advised her she's not improving she should be followed up by her primary care doctor. Patient does have some cervical lymphadenopathy of uncertain significance.  Final Clinical Impressions(s) / ED Diagnoses   Final diagnoses:  Trapezius muscle strain, left, initial encounter    New Prescriptions New Prescriptions   CYCLOBENZAPRINE (FLEXERIL) 5 MG TABLET    Take 1 tablet (5 mg total) by mouth 3 (three) times  daily as needed.   NAPROXEN (NAPROSYN) 500 MG TABLET    Take 1 po BID with food prn pain    Plan discharge  Devoria AlbeIva Mirren Gest, MD, Concha PyoFACEP    Aleeha Boline, MD 03/09/16 [phone]

## 2016-03-09 NOTE — ED Triage Notes (Signed)
Pt states that she has been having pain in her neck and left shoulder since before christmas it is worse at night when she is laying down

## 2016-05-28 ENCOUNTER — Emergency Department (HOSPITAL_COMMUNITY)
Admission: EM | Admit: 2016-05-28 | Discharge: 2016-05-28 | Disposition: A | Discharge status: Home / Self Care | Payer: Medicaid Other | Attending: Emergency Medicine | Admitting: Emergency Medicine

## 2016-05-28 ENCOUNTER — Encounter (HOSPITAL_COMMUNITY): Payer: Self-pay | Admitting: Emergency Medicine

## 2016-05-28 DIAGNOSIS — M5442 Lumbago with sciatica, left side: Secondary | ICD-10-CM | POA: Insufficient documentation

## 2016-05-28 DIAGNOSIS — F17210 Nicotine dependence, cigarettes, uncomplicated: Secondary | ICD-10-CM | POA: Insufficient documentation

## 2016-05-28 DIAGNOSIS — R030 Elevated blood-pressure reading, without diagnosis of hypertension: Secondary | ICD-10-CM

## 2016-05-28 DIAGNOSIS — M5432 Sciatica, left side: Secondary | ICD-10-CM

## 2016-05-28 LAB — URINALYSIS, ROUTINE W REFLEX MICROSCOPIC
BILIRUBIN URINE: NEGATIVE
Glucose, UA: NEGATIVE mg/dL
Hgb urine dipstick: NEGATIVE
KETONES UR: NEGATIVE mg/dL
Leukocytes, UA: NEGATIVE
NITRITE: NEGATIVE
PH: 7 (ref 5.0–8.0)
PROTEIN: NEGATIVE mg/dL
Specific Gravity, Urine: 1.01 (ref 1.005–1.030)

## 2016-05-28 LAB — POC URINE PREG, ED: Preg Test, Ur: NEGATIVE

## 2016-05-28 MED ORDER — PREDNISONE 10 MG PO TABS: ORAL_TABLET | ORAL | 0 refills | Status: DC

## 2016-05-28 MED ORDER — PREDNISONE 50 MG PO TABS
60.0000 mg | ORAL_TABLET | Freq: Once | ORAL | Status: AC
  Administered 2016-05-28: 60 mg via ORAL
  Filled 2016-05-28: qty 1

## 2016-05-28 MED ORDER — TRAMADOL HCL 50 MG PO TABS: 50.0000 mg | ORAL_TABLET | Freq: Four times a day (QID) | ORAL | 0 refills | Status: DC | PRN

## 2016-05-28 MED ORDER — METHOCARBAMOL 500 MG PO TABS: 1000.0000 mg | ORAL_TABLET | Freq: Four times a day (QID) | ORAL | 0 refills | Status: AC

## 2016-05-28 NOTE — ED Provider Notes (Signed)
[facility] Provider Note   CSN: [id number] Arrival date & time: 05/28/16  1502     History   Chief Complaint Chief Complaint  Patient presents with  . Back Pain    HPI Elizabeth Quinn is a 46 y.o. female presenting with a left lower aching back pain which has been present for the past 2 weeks and is associated with radiation of aching pain to her left lateral lower leg.   She denies previous similar symptoms.  Patient denies any new injury specifically.   There has been no weakness or numbness in the lower extremities and no urinary or bowel retention or incontinence. She denies dysuria but has had increased urinary frequency and is concerned about possible uti. She also denies hematuria, fevers or chills.   Patient does not have a history of cancer or IVDU.  She The patient has tried aleve without significant relief of symptoms.   HPI  Past Medical History:  Diagnosis Date  . Acid reflux   . Bronchitis   . Shortness of breath     There are no active problems to display for this patient.   Past Surgical History:  Procedure Laterality Date  . ABLATION    . DILATION AND CURETTAGE OF UTERUS    . INDUCED ABORTION    . TUBAL LIGATION  17 yrs ago    OB History    Gravida Para Term Preterm AB Living   5         3   SAB TAB Ectopic Multiple Live Births                   Home Medications    Prior to Admission medications   Medication Sig Start Date End Date Taking? Authorizing Provider  methocarbamol (ROBAXIN) 500 MG tablet Take 2 tablets (1,000 mg total) by mouth 4 (four) times daily. 05/28/16 06/07/16  Burgess Amor, PA-C  predniSONE (DELTASONE) 10 MG tablet Take 6 tablets day one, 5 tablets day two, 4 tablets day three, 3 tablets day four, 2 tablets day five, then 1 tablet day six 05/29/16   Burgess Amor, PA-C  traMADol (ULTRAM) 50 MG tablet Take 1 tablet (50 mg total) by mouth every 6 (six) hours as needed. 05/28/16   Burgess Amor, PA-C    Family History Family History    Problem Relation Age of Onset  . Cancer Paternal Grandfather   . Stroke Maternal Grandfather   . Diabetes Mother   . Hypertension Mother   . Anesthesia problems Neg Hx   . Hypotension Neg Hx   . Malignant hyperthermia Neg Hx   . Pseudochol deficiency Neg Hx     Social History Social History  Substance Use Topics  . Smoking status: Current Every Day Smoker    Packs/day: 1.00    Years: 21.00    Types: Cigarettes  . Smokeless tobacco: Never Used  . Alcohol use Yes     Comment: weekends-drinks 12 pack     Allergies   Patient has no known allergies.   Review of Systems Review of Systems  Constitutional: Negative for chills and fever.  Respiratory: Negative for shortness of breath.   Cardiovascular: Negative for chest pain and leg swelling.  Gastrointestinal: Negative for abdominal distention, abdominal pain and constipation.  Genitourinary: Positive for frequency. Negative for difficulty urinating, dysuria, flank pain and urgency.  Musculoskeletal: Positive for back pain. Negative for gait problem and joint swelling.  Skin: Negative for rash.  Neurological: Negative for  weakness and numbness.     Physical Exam Updated Vital Signs BP (!) 158/98 (BP Location: Right Arm)   Pulse 85   Temp 98.7 F (37.1 C) (Oral)   Resp 16   Ht  (1.753 m)   Wt 79.4 kg   SpO2 100%   BMI 25.84 kg/m   Physical Exam  Constitutional: She appears well-developed and well-nourished.  HENT:  Head: Normocephalic.  Eyes: Conjunctivae are normal.  Neck: Normal range of motion. Neck supple.  Cardiovascular: Normal rate and intact distal pulses.   Pedal pulses normal.  Pulmonary/Chest: Effort normal.  Abdominal: Soft. Bowel sounds are normal. She exhibits no distension and no mass.  Musculoskeletal: Normal range of motion. She exhibits no edema.       Lumbar back: She exhibits tenderness. She exhibits no swelling, no edema and no spasm.  Neurological: She is alert. She has normal  strength. She displays no atrophy and no tremor. No sensory deficit. Gait normal.  Reflex Scores:      Patellar reflexes are 2+ on the right side and 2+ on the left side.      Achilles reflexes are 2+ on the right side and 2+ on the left side. No strength deficit noted in hip and knee flexor and extensor muscle groups.  Ankle flexion and extension intact.  Skin: Skin is warm and dry.  Psychiatric: She has a normal mood and affect.  Nursing note and vitals reviewed.    ED Treatments / Results  Labs (all labs ordered are listed, but only abnormal results are displayed) Labs Reviewed  URINALYSIS, ROUTINE W REFLEX MICROSCOPIC  POC URINE PREG, ED    EKG  EKG Interpretation None       Radiology No results found.  Procedures Procedures (including critical care time)  Medications Ordered in ED Medications  predniSONE (DELTASONE) tablet 60 mg (60 mg Oral Given 05/28/16 1725)     Initial Impression / Assessment and Plan / ED Course  I have reviewed the triage vital signs and the nursing notes.  Pertinent labs & imaging results that were available during my care of the patient were reviewed by me and considered in my medical decision making (see chart for details).     No neuro deficit on exam or by history to suggest emergent or surgical presentation.  Also discussed worsened sx that should prompt immediate re-evaluation including distal weakness, bowel/bladder retention/incontinence.        Final Clinical Impressions(s) / ED Diagnoses   Final diagnoses:  Sciatica of left side  Single episode of elevated blood pressure    New Prescriptions New Prescriptions   METHOCARBAMOL (ROBAXIN) 500 MG TABLET    Take 2 tablets (1,000 mg total) by mouth 4 (four) times daily.   PREDNISONE (DELTASONE) 10 MG TABLET    Take 6 tablets day one, 5 tablets day two, 4 tablets day three, 3 tablets day four, 2 tablets day five, then 1 tablet day six   TRAMADOL (ULTRAM) 50 MG TABLET    Take  1 tablet (50 mg total) by mouth every 6 (six) hours as needed.     Burgess Amor, PA-C 05/28/16 1831    Vanetta Mulders, MD 05/29/16 1755

## 2016-05-28 NOTE — Discharge Instructions (Signed)
Take your next dose of prednisone tomorrow morning.  Use the the other medicines as directed.  Do not drive within 4 hours of taking oxycodone as this will make you drowsy.  Avoid lifting,  Bending,  Twisting or any other activity that worsens your pain over the next week.  Apply an  icepack  to your lower back for 10-15 minutes every 2 hours for the next 2 days.  You should get rechecked if your symptoms are not better over the next 5 days,  Or you develop increased pain,  Weakness in your leg(s) or loss of bladder or bowel function - these are potential signs of a worsening condition.  Also, as discussed, you should have your blood pressure rechecked when your pain is improved, as it is elevated today at 158/98, this may be pain related but should have this rechecked by your doctor.

## 2016-05-28 NOTE — ED Triage Notes (Signed)
Patient complaining of lower back pain radiating down left leg x 2 weeks. Denies dysuria.

## 2016-12-07 ENCOUNTER — Encounter (HOSPITAL_COMMUNITY): Payer: Self-pay | Admitting: Emergency Medicine

## 2016-12-07 ENCOUNTER — Emergency Department (HOSPITAL_COMMUNITY)
Admission: EM | Admit: 2016-12-07 | Discharge: 2016-12-07 | Disposition: A | Discharge status: Home / Self Care | Payer: Self-pay | Attending: Emergency Medicine | Admitting: Emergency Medicine

## 2016-12-07 ENCOUNTER — Emergency Department (HOSPITAL_COMMUNITY): Payer: Self-pay

## 2016-12-07 DIAGNOSIS — Z79899 Other long term (current) drug therapy: Secondary | ICD-10-CM | POA: Insufficient documentation

## 2016-12-07 DIAGNOSIS — R079 Chest pain, unspecified: Secondary | ICD-10-CM

## 2016-12-07 DIAGNOSIS — R42 Dizziness and giddiness: Secondary | ICD-10-CM | POA: Insufficient documentation

## 2016-12-07 DIAGNOSIS — F17210 Nicotine dependence, cigarettes, uncomplicated: Secondary | ICD-10-CM | POA: Insufficient documentation

## 2016-12-07 DIAGNOSIS — M5432 Sciatica, left side: Secondary | ICD-10-CM

## 2016-12-07 DIAGNOSIS — R110 Nausea: Secondary | ICD-10-CM | POA: Insufficient documentation

## 2016-12-07 LAB — BASIC METABOLIC PANEL
Anion gap: 11 (ref 5–15)
BUN: 12 mg/dL (ref 6–20)
CALCIUM: 9.9 mg/dL (ref 8.9–10.3)
CO2: 27 mmol/L (ref 22–32)
CREATININE: 0.89 mg/dL (ref 0.44–1.00)
Chloride: 108 mmol/L (ref 101–111)
GFR calc Af Amer: >60 mL/min (ref >60)
GLUCOSE: 86 mg/dL (ref 65–99)
Potassium: 4.4 mmol/L (ref 3.5–5.1)
Sodium: 146 mmol/L — ABNORMAL HIGH (ref 135–145)

## 2016-12-07 LAB — CBC
HCT: 44.4 % (ref 36.0–46.0)
Hemoglobin: 15.5 g/dL — ABNORMAL HIGH (ref 12.0–15.0)
MCH: 28.8 pg (ref 26.0–34.0)
MCHC: 34.9 g/dL (ref 30.0–36.0)
MCV: 82.5 fL (ref 78.0–100.0)
PLATELETS: 333 10*3/uL (ref 150–400)
RBC: 5.38 MIL/uL — ABNORMAL HIGH (ref 3.87–5.11)
RDW: 13.9 % (ref 11.5–15.5)
WBC: 9.1 10*3/uL (ref 4.0–10.5)

## 2016-12-07 LAB — I-STAT TROPONIN, ED: TROPONIN I, POC: 0 ng/mL (ref 0.00–0.08)

## 2016-12-07 LAB — TROPONIN I: Troponin I: <0.03 ng/mL (ref <0.03)

## 2016-12-07 NOTE — ED Notes (Signed)
From radiology 

## 2016-12-07 NOTE — ED Provider Notes (Signed)
[facility]-EMERGENCY DEPT Provider Note   CSN: [id number] Arrival date & time: 12/07/16  1712     History   Chief Complaint Chief Complaint  Patient presents with  . Claudication    L hip and leg pain x 1 hour  . Chest Pain    HPI Elizabeth Quinn is a 46 y.o. female.  Left mid sternal chest pain described as "swallowing something whole" without dyspnea or diaphoresis.  She does complain of nausea and dizziness. No previous history of heart disease. No diabetes or hypertension. She does smoke cigarettes. She did not eat anything today prior to event and was at a birthday party for her granddaughter.  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 Severity of symptoms is mild to moderate. Nothing makes symptoms better or worse      Past Medical History:  Diagnosis Date  . Acid reflux   . Bronchitis   . Shortness of breath     There are no active problems to display for this patient.   Past Surgical History:  Procedure Laterality Date  . ABLATION    . DILATION AND CURETTAGE OF UTERUS    . INDUCED ABORTION    . TUBAL LIGATION  17 yrs ago    OB History    Gravida Para Term Preterm AB Living   5         3   SAB TAB Ectopic Multiple Live Births                   Home Medications    Prior to Admission medications   Medication Sig Start Date End Date Taking? Authorizing Provider  gabapentin (NEURONTIN) 300 MG capsule Take 300 mg by mouth 2 (two) times daily.   Yes [provider]    Family History Family History  Problem Relation Age of Onset  . Cancer Paternal Grandfather   . Stroke Maternal Grandfather   . Diabetes Mother   . Hypertension Mother   . Anesthesia problems Neg Hx   . Hypotension Neg Hx   . Malignant hyperthermia Neg Hx   . Pseudochol deficiency Neg Hx     Social History Social History  Substance Use Topics  . Smoking status: Current Every Day Smoker    Packs/day: 1.00    Years: 21.00    Types:  Cigarettes  . Smokeless tobacco: Never Used  . Alcohol use Yes     Comment: weekends-drinks 12 pack     Allergies   Patient has no known allergies.   Review of Systems Review of Systems  All other systems reviewed and are negative.    Physical Exam Updated Vital Signs BP (!) 146/97 (BP Location: Left Arm)   Pulse 73   Resp 16   Ht  (1.753 m)   Wt 79.4 kg (175 lb)   SpO2 100%   BMI 25.84 kg/m   Physical Exam  Constitutional: She is oriented to person, place, and time. She appears well-developed and well-nourished.  HENT:  Head: Normocephalic and atraumatic.  Eyes: Conjunctivae are normal.  Neck: Neck supple.  Cardiovascular: Normal rate and regular rhythm.   Pulmonary/Chest: Effort normal and breath sounds normal.  Abdominal: Soft. Bowel sounds are normal.  Musculoskeletal:  Pain with straight leg raise on left  Neurological: She is alert and oriented to person, place, and time.  Skin: Skin is warm and dry.  Psychiatric: She has a normal mood and affect. Her behavior is normal.  Nursing note and vitals reviewed.    ED Treatments / Results  Labs (all labs ordered are listed, but only abnormal results are displayed) Labs Reviewed  BASIC METABOLIC PANEL - Abnormal; Notable for the following:       Result Value   Sodium 146 (*)    All other components within normal limits  CBC - Abnormal; Notable for the following:    RBC 5.38 (*)    Hemoglobin 15.5 (*)    All other components within normal limits  TROPONIN I  I-STAT TROPONIN, ED    EKG  EKG Interpretation  Date/Time:  Saturday December 07 2016 17:34:21 EDT Ventricular Rate:  82 PR Interval:    QRS Duration: 90 QT Interval:  381 QTC Calculation: 445 R Axis:   76 Text Interpretation:  Sinus rhythm Probable left atrial enlargement Confirmed by Donnetta Hutching ([id number]) on 12/07/2016 8:27:22 PM       Radiology Dg Chest 2 View  Result Date: 12/07/2016 CLINICAL DATA:  Chest pain and lightheadedness  today EXAM: CHEST  2 VIEW COMPARISON:  01/03/2015 FINDINGS: Moderate hyperinflation, unchanged. No consolidation. Mild interstitial coarsening, unchanged and probably chronic. No effusions. Normal hilar and mediastinal contours. Normal heart size. IMPRESSION: Hyperinflation and chronic appearing interstitial coarsening. No consolidation or effusion. Electronically Signed   By: Ellery Plunk M.D.   On: 12/07/2016 18:47    Procedures Procedures (including critical care time)  Medications Ordered in ED Medications - No data to display   Initial Impression / Assessment and Plan / ED Course  I have reviewed the triage vital signs and the nursing notes.  Pertinent labs & imaging results that were available during my care of the patient were reviewed by me and considered in my medical decision making (see chart for details).     Patient is hemodynamically stable. She is relatively low risk for ACS or PE. Workup including EKG, troponin, chest x-ray all negative.  She was pain-free at discharge. She will follow-up with her primary care doctor.  Final Clinical Impressions(s) / ED Diagnoses   Final diagnoses:  Chest pain, unspecified type  Sciatica, left side    New Prescriptions Discharge Medication List as of 12/07/2016  8:46 PM       Donnetta Hutching, MD 12/07/16 2112

## 2016-12-07 NOTE — ED Triage Notes (Signed)
L hip and leg pain x 1 hour while at grandchilds party  Followed by the health department

## 2016-12-07 NOTE — ED Notes (Signed)
Meal provided and consumed

## 2016-12-07 NOTE — Discharge Instructions (Signed)
Tests showed no life-threatening condition. Recommend eating regular meals. Try to stop smoking.

## 2017-05-21 ENCOUNTER — Emergency Department (HOSPITAL_COMMUNITY): Payer: Self-pay

## 2017-05-21 ENCOUNTER — Encounter (HOSPITAL_COMMUNITY): Payer: Self-pay | Admitting: Emergency Medicine

## 2017-05-21 ENCOUNTER — Emergency Department (HOSPITAL_COMMUNITY)
Admission: EM | Admit: 2017-05-21 | Discharge: 2017-05-21 | Disposition: A | Discharge status: Home / Self Care | Payer: Self-pay | Attending: Emergency Medicine | Admitting: Emergency Medicine

## 2017-05-21 ENCOUNTER — Other Ambulatory Visit: Payer: Self-pay

## 2017-05-21 DIAGNOSIS — F17210 Nicotine dependence, cigarettes, uncomplicated: Secondary | ICD-10-CM | POA: Insufficient documentation

## 2017-05-21 DIAGNOSIS — Z79899 Other long term (current) drug therapy: Secondary | ICD-10-CM | POA: Insufficient documentation

## 2017-05-21 DIAGNOSIS — I10 Essential (primary) hypertension: Secondary | ICD-10-CM | POA: Insufficient documentation

## 2017-05-21 DIAGNOSIS — R0789 Other chest pain: Secondary | ICD-10-CM | POA: Insufficient documentation

## 2017-05-21 HISTORY — DX: Essential (primary) hypertension: I10

## 2017-05-21 LAB — COMPREHENSIVE METABOLIC PANEL
ALT: 20 U/L (ref 14–54)
AST: 21 U/L (ref 15–41)
Albumin: 3.9 g/dL (ref 3.5–5.0)
Alkaline Phosphatase: 77 U/L (ref 38–126)
Anion gap: 9 (ref 5–15)
BUN: 8 mg/dL (ref 6–20)
CHLORIDE: 104 mmol/L (ref 101–111)
CO2: 26 mmol/L (ref 22–32)
Calcium: 9.4 mg/dL (ref 8.9–10.3)
Creatinine, Ser: 0.83 mg/dL (ref 0.44–1.00)
Glucose, Bld: 90 mg/dL (ref 65–99)
POTASSIUM: 3.5 mmol/L (ref 3.5–5.1)
Sodium: 139 mmol/L (ref 135–145)
Total Bilirubin: 0.6 mg/dL (ref 0.3–1.2)
Total Protein: 7.8 g/dL (ref 6.5–8.1)

## 2017-05-21 LAB — CBC WITH DIFFERENTIAL/PLATELET
BASOS ABS: 0 10*3/uL (ref 0.0–0.1)
Basophils Relative: 1 %
EOS PCT: 4 %
Eosinophils Absolute: 0.2 10*3/uL (ref 0.0–0.7)
HCT: 42.2 % (ref 36.0–46.0)
Hemoglobin: 13.6 g/dL (ref 12.0–15.0)
LYMPHS ABS: 1.9 10*3/uL (ref 0.7–4.0)
Lymphocytes Relative: 41 %
MCH: 26.9 pg (ref 26.0–34.0)
MCHC: 32.2 g/dL (ref 30.0–36.0)
MCV: 83.6 fL (ref 78.0–100.0)
MONO ABS: 0.5 10*3/uL (ref 0.1–1.0)
Monocytes Relative: 11 %
Neutro Abs: 2 10*3/uL (ref 1.7–7.7)
Neutrophils Relative %: 43 %
PLATELETS: 318 10*3/uL (ref 150–400)
RBC: 5.05 MIL/uL (ref 3.87–5.11)
RDW: 13.7 % (ref 11.5–15.5)
WBC: 4.6 10*3/uL (ref 4.0–10.5)

## 2017-05-21 LAB — TROPONIN I

## 2017-05-21 MED ORDER — HYDROCHLOROTHIAZIDE 25 MG PO TABS: 25.0000 mg | ORAL_TABLET | Freq: Every day | ORAL | 0 refills | Status: DC

## 2017-05-21 NOTE — ED Triage Notes (Signed)
PT c/o left sided/epigastric chest pain tightness x2 days. PT states she was dx with HTN x2 weeks ago but afraid to start taking the lisinopril.

## 2017-05-21 NOTE — ED Provider Notes (Addendum)
[facility] EMERGENCY DEPARTMENT Provider Note   CSN: [id number] Arrival date & time: 05/21/17  4742     History   Chief Complaint Chief Complaint  Patient presents with  . Chest Pain    HPI Elizabeth Quinn is a 47 y.o. female.  Complains of chest pain onset 2 days ago at subxiphoid area and left parasternal area constant, worse with lying supine and improved with sitting upright.  Nonexertional.  No shortness of breath nausea or sweatiness.  No other associated symptoms.  No treatment prior to coming here.  She was diagnosed with hypertension 2 weeks ago.  Prescription written for lisinopril which she filled but has not started taking as she was concerned as family members developed angioedema as result of taking lisinopril  HPI  Past Medical History:  Diagnosis Date  . Acid reflux   . Bronchitis   . Hypertension   . Shortness of breath     There are no active problems to display for this patient.   Past Surgical History:  Procedure Laterality Date  . ABLATION    . DILATION AND CURETTAGE OF UTERUS    . INDUCED ABORTION    . TUBAL LIGATION  17 yrs ago     OB History    Gravida  5   Para      Term      Preterm      AB      Living  3     SAB      TAB      Ectopic      Multiple      Live Births               Home Medications    Prior to Admission medications   Medication Sig Start Date End Date Taking? Authorizing Provider  gabapentin (NEURONTIN) 300 MG capsule Take 300 mg by mouth 2 (two) times daily.    [provider]    Family History Family History  Problem Relation Age of Onset  . Cancer Paternal Grandfather   . Stroke Maternal Grandfather   . Diabetes Mother   . Hypertension Mother   . Anesthesia problems Neg Hx   . Hypotension Neg Hx   . Malignant hyperthermia Neg Hx   . Pseudochol deficiency Neg Hx   Family history negative for coronary disease  Social History Social History   Tobacco Use  . Smoking status:  Current Every Day Smoker    Packs/day: 1.00    Years: 21.00    Pack years: 21.00    Types: Cigarettes  . Smokeless tobacco: Never Used  Substance Use Topics  . Alcohol use: Yes    Comment: weekends-drinks 12 pack  . Drug use: No     Allergies   Patient has no known allergies.   Review of Systems Review of Systems  Constitutional: Negative.   HENT: Negative.   Respiratory: Negative.   Cardiovascular: Positive for chest pain.  Gastrointestinal: Negative.   Genitourinary:       Amenorrheic  Musculoskeletal: Negative.   Skin: Negative.   Neurological: Negative.   Psychiatric/Behavioral: Negative.   All other systems reviewed and are negative.    Physical Exam Updated Vital Signs BP (!) 169/103   Pulse 74   Temp 98.2 F (36.8 C) (Oral)   Resp (!) 28   Ht 5\' 9"  (1.753 m)   Wt 82.1 kg (181 lb)   SpO2 100%   BMI 26.73 kg/m   Physical  Exam  Constitutional: She appears well-developed and well-nourished.  HENT:  Head: Normocephalic and atraumatic.  Eyes: Pupils are equal, round, and reactive to light. Conjunctivae are normal.  Neck: Neck supple. No tracheal deviation present. No thyromegaly present.  Cardiovascular: Normal rate, regular rhythm and normal heart sounds. Exam reveals no friction rub.  No murmur heard. Pulmonary/Chest: Effort normal and breath sounds normal.  Abdominal: Soft. Bowel sounds are normal. She exhibits no distension. There is no tenderness.  Musculoskeletal: Normal range of motion. She exhibits no edema or tenderness.  Neurological: She is alert. Coordination normal.  Skin: Skin is warm and dry. Capillary refill takes less than 2 seconds. No rash noted.  Psychiatric: She has a normal mood and affect.  Nursing note and vitals reviewed.    ED Treatments / Results  Labs (all labs ordered are listed, but only abnormal results are displayed) Labs Reviewed  CBC WITH DIFFERENTIAL/PLATELET  COMPREHENSIVE METABOLIC PANEL  TROPONIN I     EKG None  EKG Interpretation  Date/Time:  Wednesday May 21 2017 09:40:17 EDT Ventricular Rate:  83 PR Interval:  160 QRS Duration: 86 QT Interval:  400 QTC Calculation: 470 R Axis:   45 Text Interpretation:  Normal sinus rhythm Normal ECG No significant change since last tracing Confirmed by Doug SouJacubowitz, Elika Godar [phone]([id number]) on 05/21/2017 10:43:18 AM      Results for orders placed or performed during the hospital encounter of 05/21/17  CBC with Differential  Result Value Ref Range   WBC 4.6 4.0 - 10.5 K/uL   RBC 5.05 3.87 - 5.11 MIL/uL   Hemoglobin 13.6 12.0 - 15.0 g/dL   HCT 57.842.2 46.936.0 - 62.946.0 %   MCV 83.6 78.0 - 100.0 fL   MCH 26.9 26.0 - 34.0 pg   MCHC 32.2 30.0 - 36.0 g/dL   RDW 52.813.7 41.311.5 - 24.415.5 %   Platelets 318 150 - 400 K/uL   Neutrophils Relative % 43 %   Neutro Abs 2.0 1.7 - 7.7 K/uL   Lymphocytes Relative 41 %   Lymphs Abs 1.9 0.7 - 4.0 K/uL   Monocytes Relative 11 %   Monocytes Absolute 0.5 0.1 - 1.0 K/uL   Eosinophils Relative 4 %   Eosinophils Absolute 0.2 0.0 - 0.7 K/uL   Basophils Relative 1 %   Basophils Absolute 0.0 0.0 - 0.1 K/uL  Comprehensive metabolic panel  Result Value Ref Range   Sodium 139 135 - 145 mmol/L   Potassium 3.5 3.5 - 5.1 mmol/L   Chloride 104 101 - 111 mmol/L   CO2 26 22 - 32 mmol/L   Glucose, Bld 90 65 - 99 mg/dL   BUN 8 6 - 20 mg/dL   Creatinine, Ser 0.100.83 0.44 - 1.00 mg/dL   Calcium 9.4 8.9 - 27.210.3 mg/dL   Total Protein 7.8 6.5 - 8.1 g/dL   Albumin 3.9 3.5 - 5.0 g/dL   AST 21 15 - 41 U/L   ALT 20 14 - 54 U/L   Alkaline Phosphatase 77 38 - 126 U/L   Total Bilirubin 0.6 0.3 - 1.2 mg/dL   GFR calc non Af Amer >60 >60 mL/min   GFR calc Af Amer >60 >60 mL/min   Anion gap 9 5 - 15  Troponin I  Result Value Ref Range   Troponin I <0.03 <0.03 ng/mL   Dg Chest 2 View  Result Date: 05/21/2017 CLINICAL DATA:  Chest pain for 5 days EXAM: CHEST - 2 VIEW COMPARISON:  12/07/2016 FINDINGS: The heart size  and mediastinal contours are within  normal limits. Both lungs are clear. The visualized skeletal structures are unremarkable. IMPRESSION: No active cardiopulmonary disease. Electronically Signed   By: Elige Ko   On: 05/21/2017 10:54   Radiology No results found.  Procedures Procedures (including critical care time)  Medications Ordered in ED Medications - No data to display  Clients pain medicine Initial Impression / Assessment and Plan / ED Course  I have reviewed the triage vital signs and the nursing notes.  Pertinent labs & imaging results that were available during my care of the patient were reviewed by me and considered in my medical decision making (see chart for details).     11:45 AM patient resting comfortably.  No distress.  Plan discontinue lisinopril.  Prescription HCTZ.  Keep scheduled appointment with PMD 05/28/2017 Symptoms highly atypical for acute coronary syndrome.  No EKG evidence of pericarditis no rubs or abnormal heart sounds heard.  Heart score equals 2 I counseled patient for 5 minutes on smoking cessation Final Clinical Impressions(s) / ED Diagnoses  Diagnosis #1 atypical chest pain #2 elevated blood pressure Final diagnoses:  None   #3 tobacco abuse ED Discharge Orders    None       Doug Sou, MD 05/21/17 1152    Doug Sou, MD 05/21/17 1153    Doug Sou, MD 05/21/17 1157

## 2017-05-21 NOTE — Discharge Instructions (Addendum)
Do not take lisinopril.   Instead take the medication prescribed today for blood pressure.  You can take Tylenol as directed for pain keep your scheduled appointment with your primary care physician May 28, 2017.  Ask your doctor to recheck your blood pressure.  Today's was elevated at 145/87. Ask your doctor to help you to stop smoking

## 2017-06-20 NOTE — Congregational Nurse Program (Signed)
Congregational Nurse Program Note  Date of Encounter: 06/20/2017  Past Medical History: Past Medical History:  Diagnosis Date  . Acid reflux   . Bronchitis   . Hypertension   . Shortness of breath     Encounter Details: CNP Questionnaire - 06/18/17 0930      Questionnaire   Patient Status  Not Applicable    Race  Black or African American    [facility] [place]    Insurance  Not Applicable    Uninsured  Uninsured (NEW 1x/quarter)    Food  No food insecurities    Housing/Utilities  Yes, have permanent housing    Transportation  No transportation needs    Interpersonal Safety  Yes, feel physically and emotionally safe where you currently live    Medication  No medication insecurities    Medical Provider  Yes    Referrals  Not Applicable    ED Visit Averted  Not Applicable    Life-Saving Intervention Made  Not Applicable      [facility]. [facility] P 76. Jenene SlickerEmma Alassane Kalafut RN, [place] [facility] [phone]

## 2017-08-11 ENCOUNTER — Encounter (HOSPITAL_COMMUNITY): Payer: Self-pay | Admitting: Emergency Medicine

## 2017-08-11 ENCOUNTER — Other Ambulatory Visit: Payer: Self-pay

## 2017-08-11 ENCOUNTER — Emergency Department (HOSPITAL_COMMUNITY): Payer: Self-pay

## 2017-08-11 ENCOUNTER — Emergency Department (HOSPITAL_COMMUNITY)
Admission: EM | Admit: 2017-08-11 | Discharge: 2017-08-11 | Disposition: A | Discharge status: Home / Self Care | Payer: Self-pay | Attending: Emergency Medicine | Admitting: Emergency Medicine

## 2017-08-11 DIAGNOSIS — M25562 Pain in left knee: Secondary | ICD-10-CM | POA: Insufficient documentation

## 2017-08-11 DIAGNOSIS — F17210 Nicotine dependence, cigarettes, uncomplicated: Secondary | ICD-10-CM | POA: Insufficient documentation

## 2017-08-11 DIAGNOSIS — I10 Essential (primary) hypertension: Secondary | ICD-10-CM | POA: Insufficient documentation

## 2017-08-11 DIAGNOSIS — Z79899 Other long term (current) drug therapy: Secondary | ICD-10-CM | POA: Insufficient documentation

## 2017-08-11 DIAGNOSIS — M79662 Pain in left lower leg: Secondary | ICD-10-CM | POA: Insufficient documentation

## 2017-08-11 DIAGNOSIS — M7122 Synovial cyst of popliteal space [Baker], left knee: Secondary | ICD-10-CM | POA: Insufficient documentation

## 2017-08-11 MED ORDER — HYDROCODONE-ACETAMINOPHEN 5-325 MG PO TABS: ORAL_TABLET | ORAL | 0 refills | Status: DC

## 2017-08-11 MED ORDER — IBUPROFEN 800 MG PO TABS
800.0000 mg | ORAL_TABLET | Freq: Once | ORAL | Status: AC
  Administered 2017-08-11: 800 mg via ORAL
  Filled 2017-08-11: qty 1

## 2017-08-11 MED ORDER — IBUPROFEN 800 MG PO TABS: 800.0000 mg | ORAL_TABLET | Freq: Three times a day (TID) | ORAL | 0 refills | Status: DC

## 2017-08-11 NOTE — Discharge Instructions (Signed)
Elevate and apply ice packs on and off to your knee.  Wear the knee brace as needed for support when walking or standing, do not wear continuously.  Call the orthopedic provider listed to arrange a follow-up appointment.  Return to the ER for any worsening symptoms such as fever, chills, redness, increasing pain, or swelling of the knee.

## 2017-08-11 NOTE — ED Provider Notes (Signed)
[facility] EMERGENCY DEPARTMENT Provider Note   CSN: [id number] Arrival date & time: 08/11/17  1414     History   Chief Complaint Chief Complaint  Patient presents with  . Knee Pain    HPI Elizabeth Quinn is a 47 y.o. female.  HPI   [name] is a 47 y.o. female who presents to the Emergency Department complaining of pain and swelling of her left knee.  Symptoms have been present for 1 week.  She describes an aching pain all around her knee, but pain is worse in the backside of her knee and into her left calf.  Pain is worse with weightbearing.  She has taken tylenol without relief.  She denies known injury, but does admit to frequent activity.  No fever, chills, numbness or weakness of the leg or previous knee pain.  No history of DVT/PE, no chest pain, no shortness of breath   Past Medical History:  Diagnosis Date  . Acid reflux   . Bronchitis   . Hypertension   . Shortness of breath     There are no active problems to display for this patient.   Past Surgical History:  Procedure Laterality Date  . ABLATION    . DILATION AND CURETTAGE OF UTERUS    . INDUCED ABORTION    . TUBAL LIGATION  17 yrs ago     OB History    Gravida  5   Para      Term      Preterm      AB      Living  3     SAB      TAB      Ectopic      Multiple      Live Births               Home Medications    Prior to Admission medications   Medication Sig Start Date End Date Taking? Authorizing Provider  acetaminophen (TYLENOL) 500 MG tablet Take 1,000 mg by mouth every 6 (six) hours as needed.    [provider]  hydrochlorothiazide (HYDRODIURIL) 25 MG tablet Take 1 tablet (25 mg total) by mouth daily. 05/21/17   Doug Sou, MD    Family History Family History  Problem Relation Age of Onset  . Cancer Paternal Grandfather   . Stroke Maternal Grandfather   . Diabetes Mother   . Hypertension Mother   . Anesthesia problems Neg Hx   . Hypotension  Neg Hx   . Malignant hyperthermia Neg Hx   . Pseudochol deficiency Neg Hx     Social History Social History   Tobacco Use  . Smoking status: Current Every Day Smoker    Packs/day: 1.00    Years: 21.00    Pack years: 21.00    Types: Cigarettes  . Smokeless tobacco: Never Used  Substance Use Topics  . Alcohol use: Yes    Comment: weekends-drinks 12 pack  . Drug use: No     Allergies   Patient has no known allergies.   Review of Systems Review of Systems  Constitutional: Negative for chills and fever.  Respiratory: Negative for shortness of breath.   Cardiovascular: Negative for chest pain and leg swelling.  Musculoskeletal: Positive for arthralgias (Left knee pain and swelling) and joint swelling.  Skin: Negative for color change and wound.  Neurological: Negative for weakness and numbness.  All other systems reviewed and are negative.    Physical Exam Updated  Vital Signs BP (!) 126/94 (BP Location: Right Arm)   Pulse 87   Temp 98.1 F (36.7 C) (Oral)   Resp 16   Ht 5\' 9"  (1.753 m)   Wt 78.3 kg (172 lb 11.2 oz)   SpO2 100%   BMI 25.50 kg/m   Physical Exam  Constitutional: She is oriented to person, place, and time. She appears well-developed and well-nourished. No distress.  Cardiovascular: Normal rate, regular rhythm and intact distal pulses.  Pulmonary/Chest: Effort normal and breath sounds normal.  Musculoskeletal: She exhibits edema and tenderness. She exhibits no deformity.  Mild to moderate edema of the anterior left knee.  No erythema, excessive warmth, or step-off deformity.  ttp at the left popliteal fossa and upper calf.  No edema of the lower leg.    Neurological: She is alert and oriented to person, place, and time. She exhibits normal muscle tone. Coordination normal.  Skin: Skin is warm and dry. Capillary refill takes less than 2 seconds. No erythema.  Psychiatric: She has a normal mood and affect.  Nursing note and vitals reviewed.    ED  Treatments / Results  Labs (all labs ordered are listed, but only abnormal results are displayed) Labs Reviewed - No data to display  EKG None  Radiology US Venous Img Lower Unilateral Left  Result Date: 08/11/2017 CLINICAL DATA:  47 year old with left lower extremity calf and knee pain. EXAM: LEFT LOWER EXTREMITY VENOUS DOPPLER ULTRASOUND TECHNIQUE: Gray-scale sonography with graded compression, as well as color Doppler and duplex ultrasound were performed to evaluate the lower extremity deep venous systems from the level of the common femoral vein and including the common femoral, femoral, profunda femoral, popliteal and calf veins including the posterior tibial, peroneal and gastrocnemius veins when visible. The superficial great saphenous vein was also interrogated. Spectral Doppler was utilized to evaluate flow at rest and with distal augmentation maneuvers in the common femoral, femoral and popliteal veins. COMPARISON:  None. FINDINGS: Contralateral Common Femoral Vein: Respiratory phasicity is normal and symmetric with the symptomatic side. No evidence of thrombus. Normal compressibility. Common Femoral Vein: No evidence of thrombus. Normal compressibility, respiratory phasicity and response to augmentation. Saphenofemoral Junction: No evidence of thrombus. Normal compressibility and flow on color Doppler imaging. Profunda Femoral Vein: No evidence of thrombus. Normal compressibility and flow on color Doppler imaging. Femoral Vein: No evidence of thrombus. Normal compressibility, respiratory phasicity and response to augmentation. Popliteal Vein: No evidence of thrombus. Normal compressibility, respiratory phasicity and response to augmentation. Calf Veins: No evidence of thrombus. Normal compressibility and flow on color Doppler imaging. Superficial Great Saphenous Vein: No evidence of thrombus. Normal compressibility. Venous Reflux:  None. Other Findings: There is a small hypoechoic structure in  the left popliteal fossa. Findings are most compatible with a cystic collection that measures 4.7 x 2.3 x 2.3 cm. There is no significant vascular flow within this structure. IMPRESSION: Negative for deep venous thrombosis in left lower extremity. Left knee Baker's cyst. Electronically Signed   By: Richarda Overlie M.D.   On: 08/11/2017 17:25   Dg Knee Complete 4 Views Left  Result Date: 08/11/2017 CLINICAL DATA:  Left knee pain and swelling for 2 weeks. No known injury. EXAM: LEFT KNEE - COMPLETE 4+ VIEW COMPARISON:  None. FINDINGS: There is a moderate-sized knee joint effusion with prepatellar/suprapatellar soft tissue swelling noted. No acute fracture or dislocation is identified. Femorotibial joint space widths are preserved with at most minimal marginal spurring medially. No osseous erosion is identified. IMPRESSION:  Knee joint effusion without acute osseous abnormality identified. Electronically Signed   By: Sebastian AcheAllen  Grady M.D.   On: 08/11/2017 14:55    Procedures Procedures (including critical care time)  Medications Ordered in ED Medications - No data to display   Initial Impression / Assessment and Plan / ED Course  I have reviewed the triage vital signs and the nursing notes.  Pertinent labs & imaging results that were available during my care of the patient were reviewed by me and considered in my medical decision making (see chart for details).     Patient well-appearing. No recent illness, fever, erythema, or excessive warmth to suggest septic process or indication for joint aspiration. Venous imaging negative for DVT, indicates Baker's cyst.    Knee sleeve applied . Pt agrees to elevate, ice and close orthopedic f/u.  Return precuations discussed.    Final Clinical Impressions(s) / ED Diagnoses   Final diagnoses:  Pain of left calf  Acute pain of left knee    ED Discharge Orders    None       Rosey Bathriplett, Paizlee Kinder, PA-C 08/12/17 2127    Maia PlanLong, Joshua G, MD 08/13/17 1205

## 2017-08-11 NOTE — ED Triage Notes (Signed)
Pt reports left knee pain with no injury for about a week and low back pain for over a year, worse today with no new injury.

## 2017-09-24 ENCOUNTER — Other Ambulatory Visit: Payer: Self-pay

## 2017-09-24 ENCOUNTER — Emergency Department (HOSPITAL_COMMUNITY)
Admission: EM | Admit: 2017-09-24 | Discharge: 2017-09-24 | Disposition: A | Discharge status: Home / Self Care | Payer: Medicaid Other | Attending: Emergency Medicine | Admitting: Emergency Medicine

## 2017-09-24 ENCOUNTER — Encounter (HOSPITAL_COMMUNITY): Payer: Self-pay

## 2017-09-24 DIAGNOSIS — I10 Essential (primary) hypertension: Secondary | ICD-10-CM | POA: Insufficient documentation

## 2017-09-24 DIAGNOSIS — F17210 Nicotine dependence, cigarettes, uncomplicated: Secondary | ICD-10-CM | POA: Insufficient documentation

## 2017-09-24 DIAGNOSIS — M5432 Sciatica, left side: Secondary | ICD-10-CM | POA: Insufficient documentation

## 2017-09-24 DIAGNOSIS — Z79899 Other long term (current) drug therapy: Secondary | ICD-10-CM | POA: Insufficient documentation

## 2017-09-24 DIAGNOSIS — M25562 Pain in left knee: Secondary | ICD-10-CM

## 2017-09-24 MED ORDER — HYDROCHLOROTHIAZIDE 12.5 MG PO CAPS: 12.5000 mg | ORAL_CAPSULE | Freq: Every day | ORAL | 1 refills | Status: DC

## 2017-09-24 MED ORDER — IBUPROFEN 600 MG PO TABS: 600.0000 mg | ORAL_TABLET | Freq: Four times a day (QID) | ORAL | 0 refills | Status: DC

## 2017-09-24 MED ORDER — ONDANSETRON HCL 4 MG PO TABS
4.0000 mg | ORAL_TABLET | Freq: Once | ORAL | Status: AC
  Administered 2017-09-24: 4 mg via ORAL
  Filled 2017-09-24: qty 1

## 2017-09-24 MED ORDER — DEXAMETHASONE SODIUM PHOSPHATE 10 MG/ML IJ SOLN
10.0000 mg | Freq: Once | INTRAMUSCULAR | Status: AC
  Administered 2017-09-24: 10 mg via INTRAMUSCULAR
  Filled 2017-09-24: qty 1

## 2017-09-24 MED ORDER — IBUPROFEN 800 MG PO TABS
800.0000 mg | ORAL_TABLET | Freq: Once | ORAL | Status: AC
  Administered 2017-09-24: 800 mg via ORAL
  Filled 2017-09-24: qty 1

## 2017-09-24 MED ORDER — DEXAMETHASONE 4 MG PO TABS: 4.0000 mg | ORAL_TABLET | Freq: Two times a day (BID) | ORAL | 0 refills | Status: DC

## 2017-09-24 MED ORDER — CYCLOBENZAPRINE HCL 10 MG PO TABS: 10.0000 mg | ORAL_TABLET | Freq: Three times a day (TID) | ORAL | 0 refills | Status: DC

## 2017-09-24 NOTE — ED Notes (Signed)
Pt has a history of hypertension. States she does take BP medicine, but has stopped taking the last month because she ran out.

## 2017-09-24 NOTE — Discharge Instructions (Signed)
Your blood pressure is elevated at 148/100.  Please have this rechecked soon.  A prescription for hydrochlorothiazide has been given for you to use until you can establish a primary physician.  I have reviewed your previous x-rays of the knee.  You have spurs, and/or arthritis involving the knee.  This is probably the source of your pain and discomfort.  Please use Decadron and ibuprofen daily concerning your knee and your back.  Please also use Flexeril to assist with your sciatica back pain.  This medication may cause drowsiness, please use it with caution.

## 2017-09-24 NOTE — ED Provider Notes (Signed)
[facility] EMERGENCY DEPARTMENT Provider Note   CSN: [id number] Arrival date & time: 09/24/17  1158     History   Chief Complaint Chief Complaint  Patient presents with  . Knee Pain    HPI Elizabeth Quinn is a 47 y.o. female.  Patient is a 47 year old female who presents to the emergency department with a complaint of left knee pain, and left sciatica pain.  Patient states that she has been treated for her knee in the past.  She also has been treated for her back here in the emergency department.  She was advised to see orthopedics, but does not have insurance and could not follow-up.  She denies any recent injury or fall.  She says that she has been on her feet more recently because she is been taking care of 3 grandchildren recently.  Is been no loss of bowel or bladder function.  Has been no unusual numbness in the saddle area.  No recent operations or procedures involving the left knee.  The patient presents now for assistance with her back as well as with her knee.  The history is provided by the patient.  Knee Pain   This is a chronic problem.    Past Medical History:  Diagnosis Date  . Acid reflux   . Bronchitis   . Hypertension   . Shortness of breath     There are no active problems to display for this patient.   Past Surgical History:  Procedure Laterality Date  . ABLATION    . DILATION AND CURETTAGE OF UTERUS    . INDUCED ABORTION    . TUBAL LIGATION  17 yrs ago     OB History    Gravida  5   Para      Term      Preterm      AB      Living  3     SAB      TAB      Ectopic      Multiple      Live Births               Home Medications    Prior to Admission medications   Medication Sig Start Date End Date Taking? Authorizing Provider  acetaminophen (TYLENOL) 500 MG tablet Take 1,000 mg by mouth every 6 (six) hours as needed.    [provider]  hydrochlorothiazide (HYDRODIURIL) 25 MG tablet Take 1 tablet (25 mg total)  by mouth daily. 05/21/17   Doug Sou, MD  HYDROcodone-acetaminophen (NORCO/VICODIN) 5-325 MG tablet Take one tab po q 4 hrs prn pain 08/11/17   Triplett, Tammy, PA-C  ibuprofen (ADVIL,MOTRIN) 800 MG tablet Take 1 tablet (800 mg total) by mouth 3 (three) times daily. 08/11/17   Pauline Aus, PA-C    Family History Family History  Problem Relation Age of Onset  . Cancer Paternal Grandfather   . Stroke Maternal Grandfather   . Diabetes Mother   . Hypertension Mother   . Anesthesia problems Neg Hx   . Hypotension Neg Hx   . Malignant hyperthermia Neg Hx   . Pseudochol deficiency Neg Hx     Social History Social History   Tobacco Use  . Smoking status: Current Every Day Smoker    Packs/day: 1.00    Years: 21.00    Pack years: 21.00    Types: Cigarettes  . Smokeless tobacco: Never Used  Substance Use Topics  . Alcohol use: Yes  Comment: weekends-drinks 12 pack  . Drug use: No     Allergies   Patient has no known allergies.   Review of Systems Review of Systems  Constitutional: Negative for activity change.       All ROS Neg except as noted in HPI  HENT: Negative for nosebleeds.   Eyes: Negative for photophobia and discharge.  Respiratory: Negative for cough, shortness of breath and wheezing.   Cardiovascular: Negative for chest pain and palpitations.  Gastrointestinal: Negative for abdominal pain and blood in stool.  Genitourinary: Negative for dysuria, frequency and hematuria.  Musculoskeletal: Positive for arthralgias and back pain. Negative for neck pain.  Skin: Negative.   Neurological: Negative for dizziness, seizures and speech difficulty.  Psychiatric/Behavioral: Negative for confusion and hallucinations.     Physical Exam Updated Vital Signs BP (!) 148/100 (BP Location: Right Arm)   Pulse 73   Temp 98.1 F (36.7 C) (Oral)   Resp 16   Wt 78 kg (172 lb)   SpO2 100%   BMI 25.40 kg/m   Physical Exam  Constitutional: She is oriented to person,  place, and time. She appears well-developed and well-nourished.  Non-toxic appearance.  HENT:  Head: Normocephalic.  Right Ear: Tympanic membrane and external ear normal.  Left Ear: Tympanic membrane and external ear normal.  Eyes: Pupils are equal, round, and reactive to light. EOM and lids are normal.  Neck: Normal range of motion. Neck supple. Carotid bruit is not present.  Cardiovascular: Normal rate, regular rhythm, normal heart sounds, intact distal pulses and normal pulses.  Pulmonary/Chest: Breath sounds normal. No respiratory distress.  Abdominal: Soft. Bowel sounds are normal. There is no tenderness. There is no guarding.  Musculoskeletal:       Left knee: She exhibits decreased range of motion. She exhibits no effusion. Tenderness found. Medial joint line tenderness noted.       Lumbar back: She exhibits pain and spasm.  Lymphadenopathy:       Head (right side): No submandibular adenopathy present.       Head (left side): No submandibular adenopathy present.    She has no cervical adenopathy.  Neurological: She is alert and oriented to person, place, and time. She has normal strength. No cranial nerve deficit or sensory deficit.  Skin: Skin is warm and dry.  Psychiatric: She has a normal mood and affect. Her speech is normal.  Nursing note and vitals reviewed.    ED Treatments / Results  Labs (all labs ordered are listed, but only abnormal results are displayed) Labs Reviewed - No data to display  EKG None  Radiology No results found.  Procedures Procedures (including critical care time)  Medications Ordered in ED Medications  dexamethasone (DECADRON) injection 10 mg (has no administration in time range)  ibuprofen (ADVIL,MOTRIN) tablet 800 mg (has no administration in time range)  ondansetron (ZOFRAN) tablet 4 mg (has no administration in time range)     Initial Impression / Assessment and Plan / ED Course  I have reviewed the triage vital signs and the  nursing notes.  Pertinent labs & imaging results that were available during my care of the patient were reviewed by me and considered in my medical decision making (see chart for details).       Final Clinical Impressions(s) / ED Diagnoses MDM  I reviewed the previous x-rays of the knee.  Patient has spurs present and some degenerative changes present.  No fracture dislocation noted on the previous films.  The examination  today is negative for fracture or dislocation.  There is no effusion appreciated at this time.  Patient does have pain with flexion and extension with some crepitus.  There is no numbness in the saddle area.  No sensory or motor deficit appreciated.  No evidence of cauda equina or other emergent changes related to the back.  The left sciatic pain can be reproduced with change of position as well as with leg raise.  I have asked the patient to use heat to the area.  I have also given the patient prescription for ibuprofen and steroid as well as muscle relaxer.  The patient has been given resource information for the [facility] as well as the free clinic of [place].  Prescription for hydrochlorothiazide also given to the patient to help with her blood pressure until she can arrange for a primary physician.   Final diagnoses:  Left knee pain, unspecified chronicity  Left sided sciatica    ED Discharge Orders        Ordered    cyclobenzaprine (FLEXERIL) 10 MG tablet  3 times daily     09/24/17 1404    dexamethasone (DECADRON) 4 MG tablet  2 times daily with meals     09/24/17 1404    ibuprofen (ADVIL,MOTRIN) 600 MG tablet  4 times daily     09/24/17 1404    hydrochlorothiazide (MICROZIDE) 12.5 MG capsule  Daily     09/24/17 1404       Ivery Quale, PA-C 09/24/17 1416    Gerhard Munch, MD 09/24/17 1506

## 2017-09-24 NOTE — ED Triage Notes (Signed)
Pt is having left sided sciatic nerve pain.States left knee is swollen as well. Is walking with a limp. Has been going on for 6 months. NAD

## 2018-03-01 ENCOUNTER — Emergency Department (HOSPITAL_COMMUNITY)
Admission: EM | Admit: 2018-03-01 | Discharge: 2018-03-01 | Disposition: A | Discharge status: Home / Self Care | Payer: Self-pay | Attending: Emergency Medicine | Admitting: Emergency Medicine

## 2018-03-01 ENCOUNTER — Encounter (HOSPITAL_COMMUNITY): Payer: Self-pay | Admitting: Emergency Medicine

## 2018-03-01 ENCOUNTER — Emergency Department (HOSPITAL_COMMUNITY): Payer: Self-pay

## 2018-03-01 ENCOUNTER — Other Ambulatory Visit: Payer: Self-pay

## 2018-03-01 DIAGNOSIS — R079 Chest pain, unspecified: Secondary | ICD-10-CM | POA: Insufficient documentation

## 2018-03-01 DIAGNOSIS — I10 Essential (primary) hypertension: Secondary | ICD-10-CM | POA: Insufficient documentation

## 2018-03-01 DIAGNOSIS — F17210 Nicotine dependence, cigarettes, uncomplicated: Secondary | ICD-10-CM | POA: Insufficient documentation

## 2018-03-01 DIAGNOSIS — K219 Gastro-esophageal reflux disease without esophagitis: Secondary | ICD-10-CM | POA: Insufficient documentation

## 2018-03-01 LAB — CBC
HCT: 43.8 % (ref 36.0–46.0)
Hemoglobin: 14.2 g/dL (ref 12.0–15.0)
MCH: 27 pg (ref 26.0–34.0)
MCHC: 32.4 g/dL (ref 30.0–36.0)
MCV: 83.4 fL (ref 80.0–100.0)
PLATELETS: 299 10*3/uL (ref 150–400)
RBC: 5.25 MIL/uL — ABNORMAL HIGH (ref 3.87–5.11)
RDW: 13.8 % (ref 11.5–15.5)
WBC: 5.9 10*3/uL (ref 4.0–10.5)
nRBC: 0 % (ref 0.0–0.2)

## 2018-03-01 LAB — BASIC METABOLIC PANEL
Anion gap: 6 (ref 5–15)
BUN: 11 mg/dL (ref 6–20)
CALCIUM: 9.2 mg/dL (ref 8.9–10.3)
CO2: 24 mmol/L (ref 22–32)
Chloride: 110 mmol/L (ref 98–111)
Creatinine, Ser: 0.79 mg/dL (ref 0.44–1.00)
GFR calc Af Amer: >60 mL/min (ref >60)
GFR calc non Af Amer: >60 mL/min (ref >60)
GLUCOSE: 85 mg/dL (ref 70–99)
Potassium: 3.8 mmol/L (ref 3.5–5.1)
Sodium: 140 mmol/L (ref 135–145)

## 2018-03-01 LAB — I-STAT BETA HCG BLOOD, ED (NOT ORDERABLE): I-stat hCG, quantitative: <5 m[IU]/mL (ref <5)

## 2018-03-01 LAB — POCT I-STAT TROPONIN I: TROPONIN I, POC: 0 ng/mL (ref 0.00–0.08)

## 2018-03-01 MED ORDER — CYCLOBENZAPRINE HCL 10 MG PO TABS: 10.0000 mg | ORAL_TABLET | Freq: Two times a day (BID) | ORAL | 0 refills | Status: DC | PRN

## 2018-03-01 MED ORDER — ALUM & MAG HYDROXIDE-SIMETH 200-200-20 MG/5ML PO SUSP
30.0000 mL | Freq: Once | ORAL | Status: AC
  Administered 2018-03-01: 30 mL via ORAL
  Filled 2018-03-01: qty 30

## 2018-03-01 MED ORDER — HYDROCHLOROTHIAZIDE 12.5 MG PO CAPS: 12.5000 mg | ORAL_CAPSULE | Freq: Every day | ORAL | 1 refills | Status: DC

## 2018-03-01 NOTE — ED Triage Notes (Signed)
Pt reports history of migraines for two days. Pt also reports chest pain, n/v.

## 2018-03-01 NOTE — ED Notes (Signed)
Sprite given to pt

## 2018-03-01 NOTE — Discharge Instructions (Addendum)
Tests were good.  Prescription for muscle relaxer and blood pressure medication.  You need primary care follow-up.  Take over-the-counter GERD medicines.

## 2018-03-01 NOTE — ED Provider Notes (Signed)
[facility] EMERGENCY DEPARTMENT Provider Note   CSN: [id number] Arrival date & time: 03/01/18  1531     History   Chief Complaint Chief Complaint  Patient presents with  . Headache    HPI Elizabeth Quinn is a 48 y.o. female.  Superior central chest pain described as tightness without dyspnea, diaphoresis, nausea.  Pain is worse with lying down.  No exertional component.  Past medical history includes hypertension, but she has been noncompliant with her hydrochlorothiazide.  Additionally she has GERD.  No recent travel or prolonged immobilization.     Past Medical History:  Diagnosis Date  . Acid reflux   . Bronchitis   . Hypertension   . Shortness of breath     There are no active problems to display for this patient.   Past Surgical History:  Procedure Laterality Date  . ABLATION    . DILATION AND CURETTAGE OF UTERUS    . INDUCED ABORTION    . TUBAL LIGATION  17 yrs ago     OB History    Gravida  5   Para      Term      Preterm      AB      Living  3     SAB      TAB      Ectopic      Multiple      Live Births               Home Medications    Prior to Admission medications   Medication Sig Start Date End Date Taking? Authorizing Provider  acetaminophen (TYLENOL) 500 MG tablet Take 1,000 mg by mouth every 6 (six) hours as needed.   Yes [provider]  cyclobenzaprine (FLEXERIL) 10 MG tablet Take 1 tablet (10 mg total) by mouth 2 (two) times daily as needed for muscle spasms. 03/01/18   Donnetta Hutching, MD  hydrochlorothiazide (MICROZIDE) 12.5 MG capsule Take 1 capsule (12.5 mg total) by mouth daily. Patient not taking: Reported on 03/01/2018 09/24/17   Ivery Quale, PA-C  hydrochlorothiazide (MICROZIDE) 12.5 MG capsule Take 1 capsule (12.5 mg total) by mouth daily. 03/01/18   Donnetta Hutching, MD    Family History Family History  Problem Relation Age of Onset  . Cancer Paternal Grandfather   . Stroke Maternal Grandfather   .  Diabetes Mother   . Hypertension Mother   . Anesthesia problems Neg Hx   . Hypotension Neg Hx   . Malignant hyperthermia Neg Hx   . Pseudochol deficiency Neg Hx     Social History Social History   Tobacco Use  . Smoking status: Current Every Day Smoker    Packs/day: 1.00    Years: 21.00    Pack years: 21.00    Types: Cigarettes  . Smokeless tobacco: Never Used  Substance Use Topics  . Alcohol use: Yes    Comment: weekends-drinks 12 pack  . Drug use: No     Allergies   Patient has no known allergies.   Review of Systems Review of Systems  All other systems reviewed and are negative.    Physical Exam Updated Vital Signs BP (!) 160/96   Pulse 66   Temp 98 F (36.7 C) (Oral)   Resp 14   Ht 5\' 9"  (1.753 m)   Wt 76.6 kg   SpO2 100%   BMI 24.93 kg/m   Physical Exam Vitals signs and nursing note reviewed.  Constitutional:  Appearance: She is well-developed.  HENT:     Head: Normocephalic and atraumatic.  Eyes:     Conjunctiva/sclera: Conjunctivae normal.  Neck:     Musculoskeletal: Neck supple.  Cardiovascular:     Rate and Rhythm: Normal rate and regular rhythm.  Pulmonary:     Effort: Pulmonary effort is normal.     Breath sounds: Normal breath sounds.  Abdominal:     General: Bowel sounds are normal.     Palpations: Abdomen is soft.  Musculoskeletal: Normal range of motion.  Skin:    General: Skin is warm and dry.  Neurological:     Mental Status: She is alert and oriented to person, place, and time.  Psychiatric:        Behavior: Behavior normal.      ED Treatments / Results  Labs (all labs ordered are listed, but only abnormal results are displayed) Labs Reviewed  CBC - Abnormal; Notable for the following components:      Result Value   RBC 5.25 (*)    All other components within normal limits  BASIC METABOLIC PANEL  I-STAT TROPONIN, ED  I-STAT BETA HCG BLOOD, ED ([facility], [facility], [facility] ONLY)  POCT I-STAT TROPONIN I  I-STAT BETA HCG BLOOD,  ED (NOT ORDERABLE)    EKG EKG Interpretation  Date/Time:  Sunday March 01 2018 15:48:13 EST Ventricular Rate:  85 PR Interval:  166 QRS Duration: 90 QT Interval:  388 QTC Calculation: 461 R Axis:   70 Text Interpretation:  Normal sinus rhythm Normal ECG Confirmed by Canyon Willow ([id number]) on 03/01/2018 5:06:55 PM   Radiology Dg Chest 2 View  Result Date: 03/01/2018 CLINICAL DATA:  Chest and neck pain for 2 days. EXAM: CHEST - 2 VIEW COMPARISON:  05/21/2017 FINDINGS: The heart size and mediastinal contours are within normal limits. Both lungs are clear. No pleural effusion or pneumothorax. The visualized skeletal structures are unremarkable. IMPRESSION: No active cardiopulmonary disease. Electronically Signed   By: David  Ormond M.D.   On: 03/01/2018 16:10    Procedures Procedures (including critical care time)  Medications Ordered in ED Medications  alum & mag hydroxide-simeth (MAALOX/MYLANTA) 200-200-20 MG/5ML suspension 30 mL (30 mLs Oral Given 03/01/18 1722)     Initial Impression / Assessment and Plan / ED Course  I have reviewed the triage vital signs and the nursing notes.  Pertinent labs & imaging results that were available during my care of the patient were reviewed by me and considered in my medical decision making (see chart for details).     Patient presents with chest pain which is positional in nature.  She is low risk for ACS or PE.  EKG, chest x-ray, troponin all negative.  She did get relief from a GI cocktail.  Discharge medication Flexeril 10 mg.  I will also restart her on her hydrochlorothiazide 12.5 mg daily.  Primary care follow-up.  Final Clinical Impressions(s) / ED Diagnoses   Final diagnoses:  Chest pain, unspecified type  Gastroesophageal reflux disease, esophagitis presence not specified  Hypertension, unspecified type    ED Discharge Orders         Ordered    cyclobenzaprine (FLEXERIL) 10 MG tablet  2 times daily PRN     03/01/18 1753     hydrochlorothiazide (MICROZIDE) 12.5 MG capsule  Daily     01 /05/20 1753           Donnetta Hutching, MD 03/01/18 2352

## 2018-09-08 ENCOUNTER — Emergency Department (HOSPITAL_COMMUNITY): Payer: PRIVATE HEALTH INSURANCE

## 2018-09-08 ENCOUNTER — Emergency Department (HOSPITAL_COMMUNITY)
Admission: EM | Admit: 2018-09-08 | Discharge: 2018-09-08 | Disposition: A | Discharge status: Home / Self Care | Payer: PRIVATE HEALTH INSURANCE | Attending: Emergency Medicine | Admitting: Emergency Medicine

## 2018-09-08 ENCOUNTER — Other Ambulatory Visit: Payer: Self-pay

## 2018-09-08 ENCOUNTER — Encounter (HOSPITAL_COMMUNITY): Payer: Self-pay

## 2018-09-08 DIAGNOSIS — Z79899 Other long term (current) drug therapy: Secondary | ICD-10-CM | POA: Diagnosis not present

## 2018-09-08 DIAGNOSIS — R600 Localized edema: Secondary | ICD-10-CM | POA: Diagnosis not present

## 2018-09-08 DIAGNOSIS — G8929 Other chronic pain: Secondary | ICD-10-CM | POA: Insufficient documentation

## 2018-09-08 DIAGNOSIS — M25562 Pain in left knee: Secondary | ICD-10-CM | POA: Insufficient documentation

## 2018-09-08 DIAGNOSIS — F17210 Nicotine dependence, cigarettes, uncomplicated: Secondary | ICD-10-CM | POA: Diagnosis not present

## 2018-09-08 DIAGNOSIS — R52 Pain, unspecified: Secondary | ICD-10-CM

## 2018-09-08 DIAGNOSIS — M5442 Lumbago with sciatica, left side: Secondary | ICD-10-CM | POA: Diagnosis not present

## 2018-09-08 DIAGNOSIS — I10 Essential (primary) hypertension: Secondary | ICD-10-CM | POA: Diagnosis not present

## 2018-09-08 DIAGNOSIS — M545 Low back pain: Secondary | ICD-10-CM | POA: Diagnosis present

## 2018-09-08 DIAGNOSIS — Z760 Encounter for issue of repeat prescription: Secondary | ICD-10-CM | POA: Diagnosis not present

## 2018-09-08 MED ORDER — HYDROCODONE-ACETAMINOPHEN 5-325 MG PO TABS: 1.0000 | ORAL_TABLET | ORAL | 0 refills | Status: DC | PRN

## 2018-09-08 MED ORDER — PREDNISONE 10 MG PO TABS: ORAL_TABLET | ORAL | 0 refills | Status: DC

## 2018-09-08 MED ORDER — HYDROCHLOROTHIAZIDE 12.5 MG PO TABS: 12.5000 mg | ORAL_TABLET | Freq: Every day | ORAL | 0 refills | Status: DC

## 2018-09-08 MED ORDER — PREDNISONE 50 MG PO TABS
60.0000 mg | ORAL_TABLET | Freq: Once | ORAL | Status: AC
  Administered 2018-09-08: 60 mg via ORAL
  Filled 2018-09-08: qty 1

## 2018-09-08 MED ORDER — CYCLOBENZAPRINE HCL 10 MG PO TABS: 10.0000 mg | ORAL_TABLET | Freq: Two times a day (BID) | ORAL | 0 refills | Status: DC | PRN

## 2018-09-08 NOTE — Discharge Instructions (Addendum)
Take your next dose of prednisone tomorrow morning.  Use the the other medicines as directed.  Do not drive within 4 hours of taking hydrocodone as this will make you drowsy.  Avoid lifting,  Bending,  Twisting or any other activity that worsens your pain over the next week.  Apply an  icepack  to your lower back for 10-15 minutes every 2 hours for the next 2 days.  You should get rechecked if your symptoms are not better over the next 5 days,  Or you develop increased pain,  Weakness in your leg(s) or loss of bladder or bowel function - these are symptoms of a worse injury. ° °

## 2018-09-08 NOTE — ED Triage Notes (Signed)
Pt is having left sided lower back pain that is related to sciatica. States she also has fluid on her left knee. Has been going on for a while, but is getting worse.

## 2018-09-09 NOTE — ED Provider Notes (Signed)
[facility] Provider Note   CSN: [id number] Arrival date & time: 09/08/18  1219    History   Chief Complaint Chief Complaint  Patient presents with  . Back Pain  . Knee Pain    HPI Elizabeth Quinn is a 48 y.o. female with a history of HTN, acid reflux and intermittent low back pain with sciatica presenting with left sided low back pain with radiation into her left lateral thigh.  Additionally reports history of left knee pain and intermittent swelling, reporting prior history of fluid "behind the knee" which is better currently but has sharp pain along the lateral knee cap.  Her symptoms are worsened with movement, standing and flexing.  She denies weakness or numbness in her legs, no urinary or fecal incontinence/retention, denies fevers, chills, no new injury.   Lastly, has run out of her bp medication hctz approximately one month ago, asks for refill today.  She has recently obtained medical insurance but has struggled finding provider that accepts.  She denies headache, vision changes, cp, sob.      The history is provided by the patient.    Past Medical History:  Diagnosis Date  . Acid reflux   . Bronchitis   . Hypertension   . Shortness of breath     There are no active problems to display for this patient.   Past Surgical History:  Procedure Laterality Date  . ABLATION    . DILATION AND CURETTAGE OF UTERUS    . INDUCED ABORTION    . TUBAL LIGATION  17 yrs ago     OB History    Gravida  5   Para      Term      Preterm      AB      Living  3     SAB      TAB      Ectopic      Multiple      Live Births               Home Medications    Prior to Admission medications   Medication Sig Start Date End Date Taking? Authorizing Provider  acetaminophen (TYLENOL) 500 MG tablet Take 1,000 mg by mouth every 6 (six) hours as needed.    [provider]  cyclobenzaprine (FLEXERIL) 10 MG tablet Take 1 tablet (10 mg  total) by mouth 2 (two) times daily as needed for muscle spasms. 09/08/18   Mechelle Pates, Raynelle FanningJulie, PA-C  hydrochlorothiazide (HYDRODIURIL) 12.5 MG tablet Take 1 tablet (12.5 mg total) by mouth daily. 09/08/18   Burgess AmorIdol, Cebastian Neis, PA-C  HYDROcodone-acetaminophen (NORCO/VICODIN) 5-325 MG tablet Take 1 tablet by mouth every 4 (four) hours as needed. 09/08/18   Gissell Barra, Raynelle FanningJulie, PA-C  predniSONE (DELTASONE) 10 MG tablet Take 6 tablets day one, 5 tablets day two, 4 tablets day three, 3 tablets day four, 2 tablets day five, then 1 tablet day six 09/08/18   Burgess AmorIdol, Kennidi Yoshida, PA-C    Family History Family History  Problem Relation Age of Onset  . Cancer Paternal Grandfather   . Stroke Maternal Grandfather   . Diabetes Mother   . Hypertension Mother   . Anesthesia problems Neg Hx   . Hypotension Neg Hx   . Malignant hyperthermia Neg Hx   . Pseudochol deficiency Neg Hx     Social History Social History   Tobacco Use  . Smoking status: Current Every Day Smoker    Packs/day: 1.00  Years: 21.00    Pack years: 21.00    Types: Cigarettes  . Smokeless tobacco: Never Used  Substance Use Topics  . Alcohol use: Yes    Comment: weekends-drinks 12 pack  . Drug use: No     Allergies   Patient has no known allergies.   Review of Systems Review of Systems  Constitutional: Negative for fever.  Eyes: Negative for visual disturbance.  Respiratory: Negative for shortness of breath.   Cardiovascular: Negative for chest pain and leg swelling.  Gastrointestinal: Negative for abdominal distention, abdominal pain and constipation.  Genitourinary: Negative for difficulty urinating, dysuria, flank pain, frequency and urgency.  Musculoskeletal: Positive for arthralgias, back pain and joint swelling. Negative for gait problem and myalgias.  Skin: Negative for rash.  Neurological: Negative for weakness, numbness and headaches.     Physical Exam Updated Vital Signs BP (!) 162/110 (BP Location: Right Arm)   Pulse 63   Temp  98.3 F (36.8 C) (Oral)   Resp 16   Ht 5\' 9"  (1.753 m)   Wt 77.1 kg   SpO2 100%   BMI 25.10 kg/m   Physical Exam Vitals signs and nursing note reviewed.  Constitutional:      Appearance: She is well-developed.  HENT:     Head: Normocephalic.  Eyes:     Conjunctiva/sclera: Conjunctivae normal.  Neck:     Musculoskeletal: Normal range of motion and neck supple.  Cardiovascular:     Rate and Rhythm: Normal rate.     Comments: Pedal pulses normal. Pulmonary:     Effort: Pulmonary effort is normal.  Abdominal:     General: There is no distension.     Palpations: There is no mass.  Musculoskeletal: Normal range of motion.     Left knee: She exhibits normal range of motion, no swelling, no effusion, no deformity, no erythema, no LCL laxity and no MCL laxity.     Lumbar back: She exhibits tenderness. She exhibits no bony tenderness, no swelling, no edema and no spasm.     Comments: ttp left lower lumbar region, no midline ttp. Pt has localized tenderness along the left medial patella and anterior joint space.  No obvious palpable deformity, but consistent crepitus with ROM.  Negative drawer test. Negative lachman.  Skin:    General: Skin is warm and dry.  Neurological:     Mental Status: She is alert.     Sensory: Sensation is intact. No sensory deficit.     Motor: No tremor or atrophy.     Gait: Gait normal.     Deep Tendon Reflexes:     Reflex Scores:      Patellar reflexes are 2+ on the right side and 2+ on the left side.    Comments: No strength deficit noted in hip and knee flexor and extensor muscle groups.  Ankle flexion and extension intact.      ED Treatments / Results  Labs (all labs ordered are listed, but only abnormal results are displayed) Labs Reviewed - No data to display  EKG None  Radiology Dg Cervical Spine Complete  Result Date: 09/08/2018 CLINICAL DATA:  Back pain. No known injury. EXAM: CERVICAL SPINE - COMPLETE 4+ VIEW COMPARISON:  None.  FINDINGS: Minimal disc desiccation at C5-6 with associated slight disc space narrowing and minimal retrolisthesis of C5 on C6. No acute or suspicious osseous lesion. No fracture line or displaced fracture fragment seen. No osseous neural foramen narrowings seen. Visualized paravertebral soft tissues are unremarkable.  IMPRESSION: 1. Minimal degenerative change at C5-6. 2. No acute findings. Electronically Signed   By: Bary RichardStan  Maynard M.D.   On: 09/08/2018 13:08   Dg Thoracic Spine W/swimmers  Result Date: 09/08/2018 CLINICAL DATA:  Back pain. No known injury. EXAM: THORACIC SPINE - 3 VIEWS COMPARISON:  None. FINDINGS: Minimal levoscoliosis of the lower thoracic spine. Alignment otherwise normal. No acute or suspicious osseous lesion. No fracture line or displaced fracture fragment seen. Visualized paravertebral soft tissues are unremarkable. IMPRESSION: 1. No acute findings. 2. Minimal levoscoliosis of the lower thoracic spine. Electronically Signed   By: Bary RichardStan  Maynard M.D.   On: 09/08/2018 13:07   Dg Lumbar Spine Complete  Result Date: 09/08/2018 CLINICAL DATA:  Back pain. No known injury. EXAM: LUMBAR SPINE - COMPLETE 4+ VIEW COMPARISON:  None. FINDINGS: There is no evidence of lumbar spine fracture. No evidence of pars interarticularis defect. Alignment is normal. Intervertebral disc spaces are maintained. Visualized paravertebral soft tissues are unremarkable. IMPRESSION: Negative. Electronically Signed   By: Bary RichardStan  Maynard M.D.   On: 09/08/2018 13:06   Dg Knee Complete 4 Views Left  Result Date: 09/08/2018 CLINICAL DATA:  Chronic LEFT knee pain.  No known injury. EXAM: LEFT KNEE - COMPLETE 4+ VIEW COMPARISON:  Plain film of the LEFT knee dated 08/11/2017. FINDINGS: No evidence of fracture, dislocation, or joint effusion. No evidence of arthropathy or other focal bone abnormality. Soft tissues are unremarkable. IMPRESSION: Negative. Electronically Signed   By: Bary RichardStan  Maynard M.D.   On: 09/08/2018 13:05     Procedures Procedures (including critical care time)  Medications Ordered in ED Medications  predniSONE (DELTASONE) tablet 60 mg (60 mg Oral Given 09/08/18 1602)     Initial Impression / Assessment and Plan / ED Course  I have reviewed the triage vital signs and the nursing notes.  Pertinent labs & imaging results that were available during my care of the patient were reviewed by me and considered in my medical decision making (see chart for details).        Imaging reviewed and discussed with pt. No acute findings per imaging, exam of left knee suggests possible meniscal injury. No neuro deficit on exam of lumbar spine, no concern for cauda equina.  She was given instructions for home care, prednisone and flexeril, few hydrocodone after review of [place] narc database.  Heat tx, activities as tolerated.  Referral to ortho for further management.  Also given pt refill of her hctz. Encouraged to establish primary care, several referrals given, also suggested may need to contact her insurance who can help her find in network providers.  Return precautions outlined.  The patient appears reasonably screened and/or stabilized for discharge and I doubt any other medical condition or other [facility] requiring further screening, evaluation, or treatment in the ED at this time prior to discharge.   Final Clinical Impressions(s) / ED Diagnoses   Final diagnoses:  Chronic left-sided low back pain with left-sided sciatica  Chronic pain of left knee  Medication refill  Hypertension, unspecified type    ED Discharge Orders         Ordered    cyclobenzaprine (FLEXERIL) 10 MG tablet  2 times daily PRN     09/08/18 1526    hydrochlorothiazide (HYDRODIURIL) 12.5 MG tablet  Daily     09/08/18 1526    predniSONE (DELTASONE) 10 MG tablet     09/08/18 1526    HYDROcodone-acetaminophen (NORCO/VICODIN) 5-325 MG tablet  Every 4 hours PRN  09/08/18 1526           Burgess Amordol, Morghan Kester, PA-C 09/09/18 2315     Maia PlanLong, Joshua G, MD 09/10/18 [phone]

## 2018-10-28 ENCOUNTER — Ambulatory Visit: Payer: PRIVATE HEALTH INSURANCE | Admitting: Orthopedic Surgery

## 2018-10-28 ENCOUNTER — Encounter: Payer: Self-pay | Admitting: Orthopedic Surgery

## 2018-10-28 ENCOUNTER — Other Ambulatory Visit: Payer: Self-pay

## 2018-10-28 VITALS — BP 146/92 | HR 82 | Ht 69.0 in | Wt 171.0 lb

## 2018-10-28 DIAGNOSIS — G8929 Other chronic pain: Secondary | ICD-10-CM | POA: Diagnosis not present

## 2018-10-28 DIAGNOSIS — M25462 Effusion, left knee: Secondary | ICD-10-CM

## 2018-10-28 DIAGNOSIS — M5126 Other intervertebral disc displacement, lumbar region: Secondary | ICD-10-CM | POA: Diagnosis not present

## 2018-10-28 DIAGNOSIS — M25562 Pain in left knee: Secondary | ICD-10-CM | POA: Diagnosis not present

## 2018-10-28 MED ORDER — NAPROXEN 500 MG PO TABS
500.0000 mg | ORAL_TABLET | Freq: Two times a day (BID) | ORAL | 2 refills | Status: DC
Start: 2018-10-28 — End: 2018-12-10

## 2018-10-28 MED ORDER — CYCLOBENZAPRINE HCL 10 MG PO TABS: 10.0000 mg | ORAL_TABLET | Freq: Two times a day (BID) | ORAL | 1 refills | Status: DC | PRN

## 2018-10-28 NOTE — Addendum Note (Signed)
Addended byCandice Camp on: 10/28/2018 09:18 AM   Modules accepted: Orders

## 2018-10-28 NOTE — Progress Notes (Signed)
Elizabeth Quinn  10/28/2018  HISTORY SECTION :  Chief Complaint  Patient presents with  . Leg Pain    buttock pain left/ into left leg left great toe x 2 yrs   HPI The patient presents for evaluation of lower back pain and radicular pain left leg with numbness and tingling into the left great toe for 2 years she is been to the ER several times.  She is taken Flexeril hydrocodone and Deltasone with no relief  Location lower back left leg left great toe Duration 2 years Quality burning numbness tingling Severity moderate to severe   Review of Systems  Constitutional: Positive for diaphoresis.  Respiratory: Positive for shortness of breath.   Cardiovascular: Positive for claudication and leg swelling.  Musculoskeletal: Positive for back pain, joint pain, myalgias and neck pain.  All other systems reviewed and are negative.    Past Medical History:  Diagnosis Date  . Acid reflux   . Bronchitis   . Hypertension   . Shortness of breath     Past Surgical History:  Procedure Laterality Date  . ABLATION    . DILATION AND CURETTAGE OF UTERUS    . INDUCED ABORTION    . TUBAL LIGATION  17 yrs ago     No Known Allergies   Current Outpatient Medications:  .  cyclobenzaprine (FLEXERIL) 10 MG tablet, Take 1 tablet (10 mg total) by mouth 2 (two) times daily as needed for muscle spasms., Disp: 40 tablet, Rfl: 1 .  hydrochlorothiazide (HYDRODIURIL) 12.5 MG tablet, Take 1 tablet (12.5 mg total) by mouth daily. (Patient not taking: Reported on 10/28/2018), Disp: 30 tablet, Rfl: 0 .  naproxen (NAPROSYN) 500 MG tablet, Take 1 tablet (500 mg total) by mouth 2 (two) times daily with a meal., Disp: 60 tablet, Rfl: 2   PHYSICAL EXAM SECTION: 1) BP (!) 146/92   Pulse 82   Ht 5\' 9"  (1.753 m)   Wt 171 lb (77.6 kg)   BMI 25.25 kg/m   Body mass index is 25.25 kg/m. General appearance: Well-developed well-nourished no gross deformities  2) Cardiovascular normal pulse and perfusion in the  upper and lower  extremities normal color without edema  3) Neurologically deep tendon reflexes are equal and normal, no sensation loss or deficits no pathologic reflexes  4) Psychological: Awake alert and oriented x3 mood and affect normal  5) Skin no lacerations or ulcerations no nodularity no palpable masses, no erythema or nodularity  6) Musculoskeletal:   Lumbar tenderness L5-S1 left lower back increased muscle tension no skin changes no congenital defects  Right knee no tenderness normal range of motion no instability normal muscle tone  Left knee tenderness peripatellar region and medial joint line question joint fluid in the popliteal fossa normal range of motion no instability normal muscle tone positive McMurray sign  Lower extremity strength is normal bilaterally reflexes are 2+ and equal bilaterally decreased sensation left L5 nerve root distribution normal on the right positive straight leg on the left at 35 degrees  MEDICAL DECISION SECTION:  Encounter Diagnoses  Name Primary?  . Chronic pain of left knee Yes  . Effusion of knee joint, left   . Herniated intervertebral disc of lumbar spine     Imaging On September 08, 2018 patient had a left knee x-ray was normal  She had a lumbar spine x-ray showed no gross abnormality she had a cervical spine x-ray which showed some mild see 5 6 degenerative changes.  I reviewed the  x-rays agree with the reports  CLINICAL DATA:  Back pain. No known injury.   EXAM: LUMBAR SPINE - COMPLETE 4+ VIEW   COMPARISON:  None.   FINDINGS: There is no evidence of lumbar spine fracture. No evidence of pars interarticularis defect. Alignment is normal. Intervertebral disc spaces are maintained. Visualized paravertebral soft tissues are unremarkable.   IMPRESSION: Negative.     Electronically Signed   By: Bary RichardStan  Maynard M.D.   On: 09/08/2018 13:06     Plan:  (Rx., Inj., surg., Frx, MRI/CT, XR:2)  Patient will start on  naproxen/Naprosyn 500 twice a day continue Flexeril 10 mg every 8  MRI left knee MRI lumbar spine if not approved then therapy will be needed  Meds ordered this encounter  Medications  . naproxen (NAPROSYN) 500 MG tablet    Sig: Take 1 tablet (500 mg total) by mouth 2 (two) times daily with a meal.    Dispense:  60 tablet    Refill:  2  . cyclobenzaprine (FLEXERIL) 10 MG tablet    Sig: Take 1 tablet (10 mg total) by mouth 2 (two) times daily as needed for muscle spasms.    Dispense:  40 tablet    Refill:  1    9:08 AM Fuller CanadaStanley Harrison, MD  10/28/2018

## 2018-10-28 NOTE — Patient Instructions (Signed)
We will obtain pre-certification from the insurer and call you to schedule the study. Dr Harrison will call you with the results    

## 2018-11-12 ENCOUNTER — Other Ambulatory Visit: Payer: Self-pay

## 2018-11-12 ENCOUNTER — Ambulatory Visit (HOSPITAL_COMMUNITY)
Admission: RE | Admit: 2018-11-12 | Discharge: 2018-11-12 | Disposition: A | Discharge status: Home / Self Care | Payer: PRIVATE HEALTH INSURANCE | Source: Ambulatory Visit | Attending: Orthopedic Surgery | Admitting: Orthopedic Surgery

## 2018-11-12 DIAGNOSIS — M25462 Effusion, left knee: Secondary | ICD-10-CM | POA: Diagnosis present

## 2018-11-12 DIAGNOSIS — M25562 Pain in left knee: Secondary | ICD-10-CM | POA: Insufficient documentation

## 2018-11-12 DIAGNOSIS — M5126 Other intervertebral disc displacement, lumbar region: Secondary | ICD-10-CM | POA: Insufficient documentation

## 2018-11-12 DIAGNOSIS — G8929 Other chronic pain: Secondary | ICD-10-CM | POA: Diagnosis present

## 2018-11-26 ENCOUNTER — Other Ambulatory Visit: Payer: Self-pay

## 2018-11-26 DIAGNOSIS — Z20822 Contact with and (suspected) exposure to covid-19: Secondary | ICD-10-CM

## 2018-11-27 ENCOUNTER — Telehealth: Payer: Self-pay | Admitting: Orthopedic Surgery

## 2018-11-27 LAB — NOVEL CORONAVIRUS, NAA: SARS-CoV-2, NAA: NOT DETECTED

## 2018-11-27 NOTE — Telephone Encounter (Signed)
Left message

## 2018-12-01 ENCOUNTER — Telehealth: Payer: Self-pay | Admitting: Orthopedic Surgery

## 2018-12-01 ENCOUNTER — Telehealth: Payer: Self-pay | Admitting: Radiology

## 2018-12-01 ENCOUNTER — Other Ambulatory Visit: Payer: Self-pay | Admitting: Orthopedic Surgery

## 2018-12-01 NOTE — Progress Notes (Signed)
left knee arthroscopy and partial medial meniscectomy

## 2018-12-01 NOTE — Telephone Encounter (Signed)
-----   Message from Carole Civil, MD sent at 12/01/2018  3:36 PM EDT ----- Arrange for the patient to have a left knee arthroscopy and partial medial meniscectomy

## 2018-12-01 NOTE — Telephone Encounter (Signed)
Called her, put on Oct 15th

## 2018-12-01 NOTE — Telephone Encounter (Signed)
Elizabeth Quinn said you called her and had to leave a message.  She states that she is going to be home all afternoon today and will have her cell phone with her if you would call her again.  Thanks

## 2018-12-01 NOTE — Telephone Encounter (Signed)
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  She does have a history of sciatica.  I do not see nerve impingement.  Of course dynamic spinal instability could cause stress on the sciatic nerve  In any event she is agreed to have arthroscopy of the knee to see if this is the cause of her knee pain and if her knee is completely the same after surgery she understands that this is not the source of the pain  She was told to expect a 4-week recovery

## 2018-12-03 ENCOUNTER — Telehealth: Payer: Self-pay | Admitting: Radiology

## 2018-12-03 NOTE — Telephone Encounter (Signed)
I called [employer] for authorization of the upcoming knee scope and the recording states due to unforseen circumstances the call center is closed, then line disconnected. Will have to try again later.

## 2018-12-07 ENCOUNTER — Encounter (HOSPITAL_COMMUNITY)
Admission: RE | Admit: 2018-12-07 | Discharge: 2018-12-07 | Disposition: A | Discharge status: Home / Self Care | Payer: PRIVATE HEALTH INSURANCE | Source: Ambulatory Visit | Attending: Orthopedic Surgery | Admitting: Orthopedic Surgery

## 2018-12-07 ENCOUNTER — Other Ambulatory Visit: Payer: Self-pay

## 2018-12-07 DIAGNOSIS — Z01818 Encounter for other preprocedural examination: Secondary | ICD-10-CM | POA: Insufficient documentation

## 2018-12-07 LAB — CBC WITH DIFFERENTIAL/PLATELET
Abs Immature Granulocytes: 0.01 10*3/uL (ref 0.00–0.07)
Basophils Absolute: 0.1 10*3/uL (ref 0.0–0.1)
Basophils Relative: 1 %
Eosinophils Absolute: 0.3 10*3/uL (ref 0.0–0.5)
Eosinophils Relative: 4 %
HCT: 40.2 % (ref 36.0–46.0)
Hemoglobin: 13.2 g/dL (ref 12.0–15.0)
Immature Granulocytes: 0 %
Lymphocytes Relative: 44 %
Lymphs Abs: 2.6 10*3/uL (ref 0.7–4.0)
MCH: 27.8 pg (ref 26.0–34.0)
MCHC: 32.8 g/dL (ref 30.0–36.0)
MCV: 84.8 fL (ref 80.0–100.0)
Monocytes Absolute: 0.6 10*3/uL (ref 0.1–1.0)
Monocytes Relative: 11 %
Neutro Abs: 2.3 10*3/uL (ref 1.7–7.7)
Neutrophils Relative %: 40 %
Platelets: 312 10*3/uL (ref 150–400)
RBC: 4.74 MIL/uL (ref 3.87–5.11)
RDW: 13.7 % (ref 11.5–15.5)
WBC: 5.9 10*3/uL (ref 4.0–10.5)
nRBC: 0 % (ref 0.0–0.2)

## 2018-12-07 LAB — BASIC METABOLIC PANEL
Anion gap: 10 (ref 5–15)
BUN: 10 mg/dL (ref 6–20)
CO2: 23 mmol/L (ref 22–32)
Calcium: 9.4 mg/dL (ref 8.9–10.3)
Chloride: 105 mmol/L (ref 98–111)
Creatinine, Ser: 0.89 mg/dL (ref 0.44–1.00)
GFR calc Af Amer: >60 mL/min (ref >60)
GFR calc non Af Amer: >60 mL/min (ref >60)
Glucose, Bld: 89 mg/dL (ref 70–99)
Potassium: 3.3 mmol/L — ABNORMAL LOW (ref 3.5–5.1)
Sodium: 138 mmol/L (ref 135–145)

## 2018-12-07 LAB — HCG, SERUM, QUALITATIVE: Preg, Serum: NEGATIVE

## 2018-12-07 NOTE — Patient Instructions (Addendum)
Your procedure is scheduled on: 12/10/2018  Report to [facility] at     7:30AM.  Call this number if you have problems the morning of surgery: [phone]   Remember:   Do not Eat or Drink after midnight   :     Do not wear jewelry, make-up or nail polish.  Do not wear lotions, powders, or perfumes. You may wear deodorant.  Do not shave 48 hours prior to surgery. Men may shave face and neck.  Do not bring valuables to the hospital.  Contacts, dentures or bridgework may not be worn into surgery.  Leave suitcase in the car. After surgery it may be brought to your room.  For patients admitted to the hospital, checkout time is 11:00 AM the day of discharge.   Patients discharged the day of surgery will not be allowed to drive home.    Special Instructions: Shower using CHG night before surgery and shower the day of surgery use CHG.  Use special wash - you have one bottle of CHG for all showers.  You should use approximately 1/2 of the bottle for each shower.  Knee Arthroscopy, Care After This sheet gives you information about how to care for yourself after your procedure. Your health care provider may also give you more specific instructions. If you have problems or questions, contact your health care provider. What can I expect after the procedure? After the procedure, it is common to have:  Soreness.  Swelling.  Pain that can be relieved by taking pain medicine. Follow these instructions at home: Incision care   Follow instructions from your health care provider about how to take care of your incisions. Make sure you: ? Wash your hands with soap and water before you change your bandage (dressing). If soap and water are not available, use hand sanitizer. ? Change your dressing as told by your health care provider. ? Leave stitches (sutures), staples, skin glue, or adhesive strips in place. These skin closures may need to stay in place for 2 weeks or longer. If adhesive strip  edges start to loosen and curl up, you may trim the loose edges. Do not remove adhesive strips completely unless your health care provider tells you to do that.  Check your incision areas every day for signs of infection. Check for: ? Redness. ? More swelling or pain. ? Fluid or blood. ? Warmth. ? Pus or a bad smell. Bathing  Do not take baths, swim, or use a hot tub until your health care provider approves. Ask your health care provider if you may take showers. You may only be allowed to take sponge baths. Activity  Do not use your knee to support your body weight until your health care provider says that you can. Follow weight-bearing restrictions as told. Use crutches or other devices to help you move around (assistive devices) as directed.  Ask your health care provider what activities are safe for you during recovery, and what activities you need to avoid.  If physical therapy was prescribed, do exercises as directed. Doing exercises may help improve knee movement and flexibility (range of motion).  Do not lift anything that is heavier than 10 lb (4.5 kg), or the limit that you are told, until your health care provider says that it is safe. Driving  Do not drive until your health care provider approves. You may be able to drive after 1-3 weeks.  Do not drive or use heavy machinery while taking prescription pain  medicine. Managing pain, stiffness, and swelling   If directed, put ice on the injured area: ? Put ice in a plastic bag or use the icing device (cold therapy unit) that you were given. Follow instructions from your health care provider about how to use the icing device. ? Place a towel between your skin and the bag or between your skin and the icing device. ? Leave the ice on for 20 minutes, 2-3 times a day.  Move your toes often to avoid stiffness and to lessen swelling.  Raise (elevate) the injured area above the level of your heart while you are sitting or lying  down. If you are taking blood thinners:  Before you take any medicines that contain aspirin or NSAIDs, talk with your health care provider. These medicines increase your risk for dangerous bleeding.  Take your medicine exactly as told, at the same time every day.  Avoid activities that could cause injury or bruising, and follow instructions about how to prevent falls.  Wear a medical alert bracelet or carry a card that lists what medicines you take. General instructions  Take over-the-counter and prescription medicines only as told by your health care provider.  If you are taking prescription pain medicine, take actions to prevent or treat constipation. Your health care provider may recommend that you: ? Drink enough fluid to keep your urine pale yellow. ? Eat foods that are high in fiber, such as fresh fruits and vegetables, whole grains, and beans. ? Limit foods that are high in fat and processed sugars, such as fried or sweet foods. ? Take an over-the-counter or prescription medicines for constipation.  Do not use any products that contain nicotine or tobacco, such as cigarettes and e-cigarettes. These can delay incision or bone healing. If you need help quitting, ask your health care provider.  Wear compression stockings as told by your health care provider. These stockings help to prevent blood clots and reduce swelling in your legs.  Keep all follow-up visits as told by your health care provider. This is important. Contact a health care provider if you:  Have a fever.  Have severe pain.  Have redness around an incision.  Have more swelling.  Have fluid or blood coming from an incision.  Notice that an incision feels warm to the touch.  Notice pus or a bad smell coming from an incision.  Notice that an incision opens up.  Develop a rash. Get help right away if you:  Have difficulty breathing.  Have shortness of breath.  Have chest pain.  Develop pain in your  lower leg or at the back of your knee.  Have numbness or tingling in your lower leg or your foot. Summary  Raise (elevate) the injured area above the level of your heart while you are sitting or lying down.  To help relieve pain and swelling, put ice on your leg for 20 minutes at a time, 2-3 times a day.  If you were prescribed a blood thinner, avoid activities that could cause injury or bruising, and follow instructions about how to prevent falls.  If physical therapy was prescribed, do exercises as directed. Doing exercises may help improve range of motion. This information is not intended to replace advice given to you by your health care provider. Make sure you discuss any questions you have with your health care provider. Document Released: 08/31/2004 Document Revised: 01/24/2017 Document Reviewed: 12/25/2016 Elsevier Patient Education  2020 [employer].

## 2018-12-07 NOTE — Telephone Encounter (Signed)
Called [employer] again today, no auth needed for [id number] if in network ref number for call is I [id number]

## 2018-12-08 ENCOUNTER — Other Ambulatory Visit (HOSPITAL_COMMUNITY)
Admission: RE | Admit: 2018-12-08 | Discharge: 2018-12-08 | Disposition: A | Discharge status: Home / Self Care | Payer: PRIVATE HEALTH INSURANCE | Source: Ambulatory Visit | Attending: Orthopedic Surgery | Admitting: Orthopedic Surgery

## 2018-12-08 DIAGNOSIS — Z01812 Encounter for preprocedural laboratory examination: Secondary | ICD-10-CM | POA: Diagnosis present

## 2018-12-08 DIAGNOSIS — Z20828 Contact with and (suspected) exposure to other viral communicable diseases: Secondary | ICD-10-CM | POA: Insufficient documentation

## 2018-12-08 LAB — SARS CORONAVIRUS 2 (TAT 6-24 HRS): SARS Coronavirus 2: NEGATIVE

## 2018-12-09 NOTE — H&P (Signed)
Elizabeth Quinn   10/28/2018   HISTORY SECTION :       Chief Complaint  Patient presents with  . Leg Pain      buttock pain left/ into left leg left great toe x 2 yrs    HPI The patient presents for evaluation of lower back pain and radicular pain left leg with numbness and tingling into the left great toe for 2 years she is been to the ER several times.  She is taken Flexeril hydrocodone and Deltasone with no relief   Location lower back left leg left great toe Duration 2 years Quality burning numbness tingling Severity moderate to severe     Review of Systems  Constitutional: Positive for diaphoresis.  Respiratory: Positive for shortness of breath.   Cardiovascular: Positive for claudication and leg swelling.  Musculoskeletal: Positive for back pain, joint pain, myalgias and neck pain.  All other systems reviewed and are negative.           Past Medical History:  Diagnosis Date  . Acid reflux    . Bronchitis    . Hypertension    . Shortness of breath             Past Surgical History:  Procedure Laterality Date  . ABLATION      . DILATION AND CURETTAGE OF UTERUS      . INDUCED ABORTION      . TUBAL LIGATION   17 yrs ago        No Known Allergies     Current Outpatient Medications:  .  cyclobenzaprine (FLEXERIL) 10 MG tablet, Take 1 tablet (10 mg total) by mouth 2 (two) times daily as needed for muscle spasms., Disp: 40 tablet, Rfl: 1 .  hydrochlorothiazide (HYDRODIURIL) 12.5 MG tablet, Take 1 tablet (12.5 mg total) by mouth daily. (Patient not taking: Reported on 10/28/2018), Disp: 30 tablet, Rfl: 0 .  naproxen (NAPROSYN) 500 MG tablet, Take 1 tablet (500 mg total) by mouth 2 (two) times daily with a meal., Disp: 60 tablet, Rfl: 2     PHYSICAL EXAM SECTION: 1) BP (!) 146/92   Pulse 82   Ht 5\' 9"  (1.753 m)   Wt 171 lb (77.6 kg)   BMI 25.25 kg/m   Body mass index is 25.25 kg/m. General appearance: Well-developed well-nourished no gross deformities  2)  Cardiovascular normal pulse and perfusion in the upper and lower  extremities normal color without edema  3) Neurologically deep tendon reflexes are equal and normal, no sensation loss or deficits no pathologic reflexes   4) Psychological: Awake alert and oriented x3 mood and affect normal   5) Skin no lacerations or ulcerations no nodularity no palpable masses, no erythema or nodularity   6) Musculoskeletal:    Lumbar tenderness L5-S1 left lower back increased muscle tension no skin changes no congenital defects   Right knee no tenderness normal range of motion no instability normal muscle tone   Left knee tenderness peripatellar region and medial joint line question joint fluid in the popliteal fossa normal range of motion no instability normal muscle tone positive McMurray sign   Lower extremity strength is normal bilaterally reflexes are 2+ and equal bilaterally decreased sensation left L5 nerve root distribution normal on the right positive straight leg on the left at 35 degrees   MEDICAL DECISION SECTION:      Encounter Diagnoses  Name Primary?  . Chronic pain of left knee Yes  . Effusion of knee joint,  left    . Herniated intervertebral disc of lumbar spine        Imaging On September 08, 2018 patient had a left knee x-ray was normal   She had a lumbar spine x-ray showed no gross abnormality she had a cervical spine x-ray which showed some mild see 5 6 degenerative changes.  I reviewed the x-rays agree with the reports   CLINICAL DATA:  Back pain. No known injury.   EXAM: LUMBAR SPINE - COMPLETE 4+ VIEW   COMPARISON:  None.   FINDINGS: There is no evidence of lumbar spine fracture. No evidence of pars interarticularis defect. Alignment is normal. Intervertebral disc spaces are maintained. Visualized paravertebral soft tissues are unremarkable.   IMPRESSION: Negative.     Electronically Signed   By: Franki Cabot M.D.   On: 09/08/2018 13:06    IMPRESSION: 1.  Small horizontal tear of the medial meniscus body. 2. Mild medial compartment osteoarthritis. 3. Small Baker cyst.     Electronically Signed   By: Titus Dubin M.D.   On: 11/13/2018 09:33      Plan:  (Rx., Inj., surg., Frx, MRI/CT, XR:2)  ARTHROSCOPY LEFT KNEE MEDIAL MENISECTOMY

## 2018-12-10 ENCOUNTER — Encounter (HOSPITAL_COMMUNITY)
Admission: RE | Disposition: A | Discharge status: Home / Self Care | Payer: Self-pay | Source: Home / Self Care | Attending: Orthopedic Surgery

## 2018-12-10 ENCOUNTER — Ambulatory Visit (HOSPITAL_COMMUNITY): Payer: PRIVATE HEALTH INSURANCE | Admitting: Anesthesiology

## 2018-12-10 ENCOUNTER — Ambulatory Visit (HOSPITAL_COMMUNITY)
Admission: RE | Admit: 2018-12-10 | Discharge: 2018-12-10 | Disposition: A | Discharge status: Home / Self Care | Payer: PRIVATE HEALTH INSURANCE | Attending: Orthopedic Surgery | Admitting: Orthopedic Surgery

## 2018-12-10 ENCOUNTER — Encounter (HOSPITAL_COMMUNITY): Payer: Self-pay | Admitting: *Deleted

## 2018-12-10 DIAGNOSIS — I10 Essential (primary) hypertension: Secondary | ICD-10-CM | POA: Insufficient documentation

## 2018-12-10 DIAGNOSIS — Z791 Long term (current) use of non-steroidal anti-inflammatories (NSAID): Secondary | ICD-10-CM | POA: Diagnosis not present

## 2018-12-10 DIAGNOSIS — M1712 Unilateral primary osteoarthritis, left knee: Secondary | ICD-10-CM | POA: Insufficient documentation

## 2018-12-10 DIAGNOSIS — S83242A Other tear of medial meniscus, current injury, left knee, initial encounter: Secondary | ICD-10-CM | POA: Diagnosis present

## 2018-12-10 DIAGNOSIS — M94262 Chondromalacia, left knee: Secondary | ICD-10-CM

## 2018-12-10 DIAGNOSIS — F17210 Nicotine dependence, cigarettes, uncomplicated: Secondary | ICD-10-CM | POA: Diagnosis not present

## 2018-12-10 HISTORY — PX: KNEE ARTHROSCOPY: SHX127

## 2018-12-10 SURGERY — ARTHROSCOPY, KNEE
Anesthesia: General | Site: Knee | Laterality: Left

## 2018-12-10 MED ORDER — CEFAZOLIN SODIUM-DEXTROSE 2-4 GM/100ML-% IV SOLN
2.0000 g | INTRAVENOUS | Status: AC
  Administered 2018-12-10: 2 g via INTRAVENOUS
  Filled 2018-12-10: qty 100

## 2018-12-10 MED ORDER — ONDANSETRON 4 MG PO TBDP
4.0000 mg | ORAL_TABLET | Freq: Once | ORAL | Status: AC
  Administered 2018-12-10: 10:00:00 4 mg via ORAL

## 2018-12-10 MED ORDER — PROMETHAZINE HCL 25 MG/ML IJ SOLN: 6.2500 mg | INTRAMUSCULAR | Status: DC | PRN

## 2018-12-10 MED ORDER — IBUPROFEN 800 MG PO TABS
800.0000 mg | ORAL_TABLET | Freq: Once | ORAL | Status: AC
  Administered 2018-12-10: 10:00:00 800 mg via ORAL
  Filled 2018-12-10: qty 1

## 2018-12-10 MED ORDER — LACTATED RINGERS IV SOLN
INTRAVENOUS | Status: DC
  Administered 2018-12-10: 1000 mL via INTRAVENOUS

## 2018-12-10 MED ORDER — FENTANYL CITRATE (PF) 250 MCG/5ML IJ SOLN
INTRAMUSCULAR | Status: AC
  Filled 2018-12-10: qty 5

## 2018-12-10 MED ORDER — GLYCOPYRROLATE PF 0.2 MG/ML IJ SOSY
PREFILLED_SYRINGE | INTRAMUSCULAR | Status: DC | PRN
  Administered 2018-12-10: .2 mg via INTRAVENOUS

## 2018-12-10 MED ORDER — ONDANSETRON HCL 4 MG/2ML IJ SOLN: 4.0000 mg | Freq: Once | INTRAMUSCULAR | Status: DC

## 2018-12-10 MED ORDER — HYDROMORPHONE HCL 1 MG/ML IJ SOLN: 0.2500 mg | INTRAMUSCULAR | Status: DC | PRN

## 2018-12-10 MED ORDER — FENTANYL CITRATE (PF) 100 MCG/2ML IJ SOLN
INTRAMUSCULAR | Status: DC | PRN
  Administered 2018-12-10 (×2): 50 ug via INTRAVENOUS

## 2018-12-10 MED ORDER — BUPIVACAINE-EPINEPHRINE (PF) 0.5% -1:200000 IJ SOLN
INTRAMUSCULAR | Status: AC
  Filled 2018-12-10: qty 60

## 2018-12-10 MED ORDER — 0.9 % SODIUM CHLORIDE (POUR BTL) OPTIME
TOPICAL | Status: DC | PRN
  Administered 2018-12-10: 1000 mL

## 2018-12-10 MED ORDER — ACETAMINOPHEN-CODEINE #3 300-30 MG PO TABS: 1.0000 | ORAL_TABLET | ORAL | 0 refills | Status: DC | PRN

## 2018-12-10 MED ORDER — ONDANSETRON 4 MG PO TBDP
ORAL_TABLET | ORAL | Status: AC
  Filled 2018-12-10: qty 1

## 2018-12-10 MED ORDER — CHLORHEXIDINE GLUCONATE 4 % EX LIQD: 60.0000 mL | Freq: Once | CUTANEOUS | Status: DC

## 2018-12-10 MED ORDER — SODIUM CHLORIDE 0.9 % IR SOLN
Status: DC | PRN
  Administered 2018-12-10 (×3): 3000 mL

## 2018-12-10 MED ORDER — HYDROCODONE-ACETAMINOPHEN 7.5-325 MG PO TABS
1.0000 | ORAL_TABLET | Freq: Once | ORAL | Status: AC | PRN
  Administered 2018-12-10: 1 via ORAL
  Filled 2018-12-10: qty 1

## 2018-12-10 MED ORDER — EPINEPHRINE PF 1 MG/ML IJ SOLN
INTRAMUSCULAR | Status: AC
  Filled 2018-12-10: qty 5

## 2018-12-10 MED ORDER — IBUPROFEN 800 MG PO TABS: 800.0000 mg | ORAL_TABLET | Freq: Three times a day (TID) | ORAL | 1 refills | Status: DC | PRN

## 2018-12-10 MED ORDER — LIDOCAINE 2% (20 MG/ML) 5 ML SYRINGE
INTRAMUSCULAR | Status: AC
  Filled 2018-12-10: qty 5

## 2018-12-10 MED ORDER — BUPIVACAINE-EPINEPHRINE (PF) 0.5% -1:200000 IJ SOLN
INTRAMUSCULAR | Status: DC | PRN
  Administered 2018-12-10: 60 mL via PERINEURAL

## 2018-12-10 MED ORDER — DEXAMETHASONE SODIUM PHOSPHATE 4 MG/ML IJ SOLN
INTRAMUSCULAR | Status: DC | PRN
  Administered 2018-12-10: 8 mg via INTRAVENOUS

## 2018-12-10 MED ORDER — PROPOFOL 10 MG/ML IV BOLUS
INTRAVENOUS | Status: DC | PRN
  Administered 2018-12-10: 250 mg via INTRAVENOUS

## 2018-12-10 MED ORDER — ONDANSETRON HCL 4 MG/2ML IJ SOLN
INTRAMUSCULAR | Status: DC | PRN
  Administered 2018-12-10: 4 mg via INTRAVENOUS

## 2018-12-10 MED ORDER — LIDOCAINE HCL (CARDIAC) PF 100 MG/5ML IV SOSY
PREFILLED_SYRINGE | INTRAVENOUS | Status: DC | PRN
  Administered 2018-12-10: 60 mg via INTRAVENOUS

## 2018-12-10 MED ORDER — GLYCOPYRROLATE PF 0.2 MG/ML IJ SOSY
PREFILLED_SYRINGE | INTRAMUSCULAR | Status: AC
  Filled 2018-12-10: qty 1

## 2018-12-10 MED ORDER — MIDAZOLAM HCL 2 MG/2ML IJ SOLN: 0.5000 mg | Freq: Once | INTRAMUSCULAR | Status: DC | PRN

## 2018-12-10 MED ORDER — PROPOFOL 10 MG/ML IV BOLUS
INTRAVENOUS | Status: AC
  Filled 2018-12-10: qty 40

## 2018-12-10 SURGICAL SUPPLY — 51 items
APL PRP STRL LF DISP 70% ISPRP (MISCELLANEOUS) ×1
BANDAGE ELASTIC 6 VELCRO NS (GAUZE/BANDAGES/DRESSINGS) ×3 IMPLANT
BLADE AGGRESSIVE PLUS 4.0 (BLADE) ×3 IMPLANT
BLADE SURG SZ11 CARB STEEL (BLADE) ×3 IMPLANT
CHLORAPREP W/TINT 26 (MISCELLANEOUS) ×3 IMPLANT
CLOTH BEACON ORANGE TIMEOUT ST (SAFETY) ×3 IMPLANT
COOLER CRYO IC GRAV AND TUBE (ORTHOPEDIC SUPPLIES) ×3 IMPLANT
COVER WAND RF STERILE (DRAPES) ×3 IMPLANT
CUFF CRYO KNEE18X23 MED (MISCELLANEOUS) ×2 IMPLANT
CUFF TOURN SGL QUICK 34 (TOURNIQUET CUFF) ×3
CUFF TRNQT CYL 34X4.125X (TOURNIQUET CUFF) IMPLANT
DECANTER SPIKE VIAL GLASS SM (MISCELLANEOUS) ×6 IMPLANT
GAUZE 4X4 16PLY RFD (DISPOSABLE) ×3 IMPLANT
GAUZE SPONGE 4X4 12PLY STRL (GAUZE/BANDAGES/DRESSINGS) ×3 IMPLANT
GAUZE SPONGE 4X4 16PLY XRAY LF (GAUZE/BANDAGES/DRESSINGS) ×3 IMPLANT
GAUZE XEROFORM 5X9 LF (GAUZE/BANDAGES/DRESSINGS) ×3 IMPLANT
GLOVE BIOGEL PI IND STRL 7.0 (GLOVE) ×2 IMPLANT
GLOVE BIOGEL PI INDICATOR 7.0 (GLOVE) ×4
GLOVE ECLIPSE 6.5 STRL STRAW (GLOVE) ×2 IMPLANT
GLOVE SKINSENSE NS SZ8.0 LF (GLOVE) ×2
GLOVE SKINSENSE STRL SZ8.0 LF (GLOVE) ×1 IMPLANT
GLOVE SS N UNI LF 8.5 STRL (GLOVE) ×3 IMPLANT
GOWN STRL REUS W/ TWL LRG LVL3 (GOWN DISPOSABLE) ×1 IMPLANT
GOWN STRL REUS W/TWL LRG LVL3 (GOWN DISPOSABLE) ×3
GOWN STRL REUS W/TWL XL LVL3 (GOWN DISPOSABLE) ×3 IMPLANT
IV NS IRRIG 3000ML ARTHROMATIC (IV SOLUTION) ×10 IMPLANT
KIT BLADEGUARD II DBL (SET/KITS/TRAYS/PACK) ×3 IMPLANT
KIT TURNOVER CYSTO (KITS) ×3 IMPLANT
MANIFOLD NEPTUNE II (INSTRUMENTS) ×3 IMPLANT
MARKER SKIN DUAL TIP RULER LAB (MISCELLANEOUS) ×3 IMPLANT
NDL HYPO 18GX1.5 BLUNT FILL (NEEDLE) ×1 IMPLANT
NDL HYPO 21X1.5 SAFETY (NEEDLE) ×1 IMPLANT
NDL SPNL 18GX3.5 QUINCKE PK (NEEDLE) ×1 IMPLANT
NEEDLE HYPO 18GX1.5 BLUNT FILL (NEEDLE) ×3 IMPLANT
NEEDLE HYPO 21X1.5 SAFETY (NEEDLE) ×3 IMPLANT
NEEDLE SPNL 18GX3.5 QUINCKE PK (NEEDLE) ×3 IMPLANT
NS IRRIG 1000ML POUR BTL (IV SOLUTION) ×3 IMPLANT
PACK ARTHRO LIMB DRAPE STRL (MISCELLANEOUS) ×3 IMPLANT
PAD ABD 5X9 TENDERSORB (GAUZE/BANDAGES/DRESSINGS) ×3 IMPLANT
PAD ARMBOARD 7.5X6 YLW CONV (MISCELLANEOUS) ×3 IMPLANT
PADDING CAST COTTON 6X4 STRL (CAST SUPPLIES) ×3 IMPLANT
PADDING WEBRIL 6 STERILE (GAUZE/BANDAGES/DRESSINGS) ×2 IMPLANT
PROBE BIPOLAR 50 DEGREE SUCT (MISCELLANEOUS) ×2 IMPLANT
SET ARTHROSCOPY INST (INSTRUMENTS) ×3 IMPLANT
SET BASIN LINEN APH (SET/KITS/TRAYS/PACK) ×3 IMPLANT
SUT ETHILON 3 0 FSL (SUTURE) ×3 IMPLANT
SYR 10ML LL (SYRINGE) ×3 IMPLANT
SYR 30ML LL (SYRINGE) ×3 IMPLANT
TUBE CONNECTING 12'X1/4 (SUCTIONS) ×2
TUBE CONNECTING 12X1/4 (SUCTIONS) ×4 IMPLANT
TUBING ARTHRO INFLOW-ONLY STRL (TUBING) ×3 IMPLANT

## 2018-12-10 NOTE — Transfer of Care (Signed)
Immediate Anesthesia Transfer of Care Note  Patient: Elizabeth Quinn  Procedure(s) Performed: DIAGNOSTIC KNEE ARTHROSCOPY (Left Knee)  Patient Location: PACU  Anesthesia Type:General  Level of Consciousness: awake, oriented and patient cooperative  Airway & Oxygen Therapy: Patient Spontanous Breathing  Post-op Assessment: Report given to RN and Post -op Vital signs reviewed and stable  Post vital signs: Reviewed and stable  Last Vitals:  Vitals Value Taken Time  BP 119/85 12/10/18 1004  Temp    Pulse 100 12/10/18 1011  Resp 15 12/10/18 1011  SpO2 96 % 12/10/18 1011  Vitals shown include unvalidated device data.  Last Pain:  Vitals:   12/10/18 0803  TempSrc: Oral  PainSc: 8       Patients Stated Pain Goal: 10 (17/71/16 5790)  Complications: No apparent anesthesia complications

## 2018-12-10 NOTE — Anesthesia Procedure Notes (Signed)
Procedure Name: LMA Insertion Date/Time: 12/10/2018 9:06 AM Performed by: Andree Elk, Natasha Paulson A, CRNA Pre-anesthesia Checklist: Patient identified, Emergency Drugs available, Suction available, Patient being monitored and Timeout performed Patient Re-evaluated:Patient Re-evaluated prior to induction Oxygen Delivery Method: Circle system utilized Preoxygenation: Pre-oxygenation with 100% oxygen Induction Type: IV induction LMA: LMA inserted LMA Size: 4.0 Number of attempts: 1 Dental Injury: Teeth and Oropharynx as per pre-operative assessment

## 2018-12-10 NOTE — Discharge Instructions (Signed)
We did not find any torn cartilage or torn ligaments in your knee.  We did find some arthritis on the inside part of the joint which required no surgical treatment.  As we discussed preoperatively pain may be originating in your back and being referred to your knee and after the arthroscopy really showed no significant changes in the knee I conclude that most of the pain is coming from your back     General Anesthesia, Adult, Care After This sheet gives you information about how to care for yourself after your procedure. Your health care provider may also give you more specific instructions. If you have problems or questions, contact your health care provider. What can I expect after the procedure? After the procedure, the following side effects are common:  Pain or discomfort at the IV site.  Nausea.  Vomiting.  Sore throat.  Trouble concentrating.  Feeling cold or chills.  Weak or tired.  Sleepiness and fatigue.  Soreness and body aches. These side effects can affect parts of the body that were not involved in surgery. Follow these instructions at home:  For at least 24 hours after the procedure:  Have a responsible adult stay with you. It is important to have someone help care for you until you are awake and alert.  Rest as needed.  Do not: ? Participate in activities in which you could fall or become injured. ? Drive. ? Use heavy machinery. ? Drink alcohol. ? Take sleeping pills or medicines that cause drowsiness. ? Make important decisions or sign legal documents. ? Take care of children on your own. Eating and drinking  Follow any instructions from your health care provider about eating or drinking restrictions.  When you feel hungry, start by eating small amounts of foods that are soft and easy to digest (bland), such as toast. Gradually return to your regular diet.  Drink enough fluid to keep your urine pale yellow.  If you vomit, rehydrate by drinking  water, juice, or clear broth. General instructions  If you have sleep apnea, surgery and certain medicines can increase your risk for breathing problems. Follow instructions from your health care provider about wearing your sleep device: ? Anytime you are sleeping, including during daytime naps. ? While taking prescription pain medicines, sleeping medicines, or medicines that make you drowsy.  Return to your normal activities as told by your health care provider. Ask your health care provider what activities are safe for you.  Take over-the-counter and prescription medicines only as told by your health care provider.  If you smoke, do not smoke without supervision.  Keep all follow-up visits as told by your health care provider. This is important. Contact a health care provider if:  You have nausea or vomiting that does not get better with medicine.  You cannot eat or drink without vomiting.  You have pain that does not get better with medicine.  You are unable to pass urine.  You develop a skin rash.  You have a fever.  You have redness around your IV site that gets worse. Get help right away if:  You have difficulty breathing.  You have chest pain.  You have blood in your urine or stool, or you vomit blood. Summary  After the procedure, it is common to have a sore throat or nausea. It is also common to feel tired.  Have a responsible adult stay with you for the first 24 hours after general anesthesia. It is important to have someone  help care for you until you are awake and alert.  When you feel hungry, start by eating small amounts of foods that are soft and easy to digest (bland), such as toast. Gradually return to your regular diet.  Drink enough fluid to keep your urine pale yellow.  Return to your normal activities as told by your health care provider. Ask your health care provider what activities are safe for you. This information is not intended to replace  advice given to you by your health care provider. Make sure you discuss any questions you have with your health care provider. Document Released: 05/20/2000 Document Revised: 02/14/2017 Document Reviewed: 09/27/2016 Elsevier Patient Education  2020 [employer].     Crutch Use, Adult Crutches are used to take weight off of one of your legs or feet when you stand or walk. You may need crutches to help heal after an injury or procedure. It is important to use crutches that fit right. Your crutches fit right if:  You can fit 2 or 3 fingers between your armpit and the crutch.  You use your hands, not your armpits, to hold yourself up. Do not put your armpits on the crutches. This can damage the nerves in your shoulders, arms, back, armpits, and hands. It is important that a doctor has seen you use crutches the right way before you use them at home. How to use your crutches How you will use your crutches will depend on why you need them. Your doctor may tell you not to put weight on (not to support your weight with) your hurt leg (non-weight-bearing). Or, your doctor may let you put (bear) some of your weight on the hurt leg (partial weight-bearing), but not all of your weight. Follow instructions from your doctor about weight-bearing. Do not put weight on your leg in an amount that causes pain. Walking  1. Stand on your good leg and lift both crutches at the same time. 2. Place the crutches one step-length in front of you. 3. Bring the good leg forward to meet the crutches or to land a little bit ahead of them. 4. Repeat. Going up steps If there is no handrail: 1. Step up with your good leg. 2. Step up with the crutches and your hurt leg. 3. Repeat. If there is a handrail: 1. Hold both crutches in one hand. 2. Place your other hand on the handrail. 3. Put your weight on your arms and lift your good leg up to the step. 4. Bring the crutches and the hurt leg up to that  step. 5. Repeat.  Going up steps on your butt If you do not feel steady on steps, you can go up steps on your butt. 1. Sit on the lowest step. ? Have your hurt leg out in front. ? Use your other hand to hold both crutches flat on the stairs. 2. Scoot your butt up to the next step. Use the free hand and your good leg to help you do this. Going down steps If there is no handrail: 1. Step down with your hurt leg and crutches. 2. Step down with your good leg. 3. Repeat. If there is a handrail: 1. Place your hand on the handrail. 2. Hold both crutches with your free hand. 3. Lower your hurt leg and crutch to the step below you. Keep the crutch tips in the center of the step. Never put the crutch tips on the edge of the step. 4. Lower your good  leg to that step. 5. Repeat.  Going down steps on your butt If you do not feel steady on steps, you can go down steps on your butt. 1. Sit on the highest step. ? Have your hurt leg out in front. ? Use your other hand to hold both crutches flat on the stairs. 2. Scoot your butt down to the next step. Use the free hand and your good leg to help you do this. Standing up 1. Hold the hurt leg forward. 2. Grab the armrest with one hand. Use the other hand to grab the top of the crutches. 3. Use the armrest and your crutches to pull yourself up to stand. Sitting down 1. Hold the hurt leg forward. 2. Grab the armrest with one hand. Use the other hand to grab the top of the crutches. 3. Slowly lower yourself to sit. Get help if:  You feel unsteady or wobbly using crutches.  You have any new pain.  You cannot feel a part of your body (numbness) or you have a tingling feeling.  Your crutches do not fit. Get help right away if:  You fall. This information is not intended to replace advice given to you by your health care provider. Make sure you discuss any questions you have with your health care provider. Document Released: 07/31/2007 Document  Revised: 01/24/2017 Document Reviewed: 08/04/2015 Elsevier Patient Education  2020 [employer]

## 2018-12-10 NOTE — Anesthesia Preprocedure Evaluation (Signed)
Anesthesia Evaluation  Patient identified by MRN, date of birth, ID band Patient awake    Reviewed: Allergy & Precautions, NPO status , Patient's Chart, lab work & pertinent test results  Airway Mallampati: II  TM Distance: >3 FB Neck ROM: Full    Dental no notable dental hx. (+) Teeth Intact   Pulmonary shortness of breath and with exertion, Current SmokerPatient did not abstain from smoking.,  Reports 1ppd smoker  Reports DOE Smoked today    Pulmonary exam normal breath sounds clear to auscultation       Cardiovascular Exercise Tolerance: Good hypertension, Pt. on medications negative cardio ROS Normal cardiovascular examI Rhythm:Regular Rate:Normal     Neuro/Psych negative neurological ROS  negative psych ROS   GI/Hepatic Neg liver ROS, GERD  Controlled,States off GERD meds for ~1 year  Denies any GERD Sx today    Endo/Other  negative endocrine ROS  Renal/GU negative Renal ROS  negative genitourinary   Musculoskeletal negative musculoskeletal ROS (+)   Abdominal   Peds negative pediatric ROS (+)  Hematology negative hematology ROS (+)   Anesthesia Other Findings   Reproductive/Obstetrics negative OB ROS                             Anesthesia Physical Anesthesia Plan  ASA: II  Anesthesia Plan: General   Post-op Pain Management:    Induction: Intravenous  PONV Risk Score and Plan: 2 and Midazolam, Treatment may vary due to age or medical condition, Ondansetron and Dexamethasone  Airway Management Planned: Oral ETT and LMA  Additional Equipment:   Intra-op Plan:   Post-operative Plan: Extubation in OR  Informed Consent: I have reviewed the patients History and Physical, chart, labs and discussed the procedure including the risks, benefits and alternatives for the proposed anesthesia with the patient or authorized representative who has indicated his/her understanding  and acceptance.     Dental advisory given  Plan Discussed with: CRNA  Anesthesia Plan Comments: (Plan Full PPE use Plan GA(LMA) vs  GETA D/W PT -WTP with same after Q&A)        Anesthesia Quick Evaluation

## 2018-12-10 NOTE — Anesthesia Postprocedure Evaluation (Signed)
Anesthesia Post Note  Patient: Elizabeth Quinn  Procedure(s) Performed: DIAGNOSTIC KNEE ARTHROSCOPY (Left Knee)  Patient location during evaluation: Phase II Anesthesia Type: General Level of consciousness: awake and alert, oriented and patient cooperative Pain management: pain level controlled Vital Signs Assessment: post-procedure vital signs reviewed and stable Respiratory status: spontaneous breathing Cardiovascular status: stable Postop Assessment: no apparent nausea or vomiting Anesthetic complications: no     Last Vitals:  Vitals:   12/10/18 1030 12/10/18 1038  BP: (!) 140/92 130/88  Pulse: 81 84  Resp: 18 20  Temp:  36.4 C  SpO2: 96% 96%    Last Pain:  Vitals:   12/10/18 1038  TempSrc: Oral  PainSc: 0-No pain                 Cherron Blitzer A

## 2018-12-10 NOTE — Interval H&P Note (Signed)
History and Physical Interval Note:  12/10/2018 8:56 AM   BP (!) 153/98   Temp 98.2 F (36.8 C) (Oral)   Resp 20   SpO2 98%   CBC Latest Ref Rng & Units 12/07/2018 03/01/2018 05/21/2017  WBC 4.0 - 10.5 K/uL 5.9 5.9 4.6  Hemoglobin 12.0 - 15.0 g/dL 13.2 14.2 13.6  Hematocrit 36.0 - 46.0 % 40.2 43.8 42.2  Platelets 150 - 400 K/uL 312 299 318     Elizabeth Quinn  has presented today for surgery, with the diagnosis of torn medial mensicus left knee.  The various methods of treatment have been discussed with the patient and family. After consideration of risks, benefits and other options for treatment, the patient has consented to  Procedure(s): KNEE ARTHROSCOPY WITH MEDIAL MENISCECTOMY (Left) as a surgical intervention.  The patient's history has been reviewed, patient examined, no change in status, stable for surgery.  I have reviewed the patient's chart and labs.  Questions were answered to the patient's satisfaction.     Arther Abbott

## 2018-12-10 NOTE — Op Note (Addendum)
12/10/2018  10:01 AM  PATIENT:  Elizabeth Quinn  48 y.o. female  PRE-OPERATIVE DIAGNOSIS:  torn medial mensicus left knee  POST-OPERATIVE DIAGNOSIS:  ARTHRITIS OF LEFT KNEE  PROCEDURE:  Procedure(s): DIAGNOSTIC KNEE ARTHROSCOPY (Left) -70177  Operative findings mild chondral degeneration of the medial femoral condyle grade 1 no meniscal tears no cruciate ligament tears no chondromalacia in the patella   SURGEON:  Surgeon(s) and Role:    Carole Civil, MD - Primary  Patient was seen in preop surgical site was confirmed and marked chart review was completed.  She was taken to the operating for general anesthesia she was placed in supine position leg holder was placed on the left leg  After sterile prep and drape and timeout  We established a lateral portal we placed the scope in the joint we did a diagnostic arthroscopy.  We placed a medial portal we placed a probe in the joint and did a second diagnostic arthroscopy.  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  The joint was irrigated and portals were closed with 2, 3-0 nylon sutures laterally one medially  The joint was injected with 60 cc of Marcaine with epinephrine  Sterile bandages Ace bandage and Cryo/Cuff were applied and activated  Patient was taken recovery room in stable condition  PHYSICIAN ASSISTANT:   ASSISTANTS: none   ANESTHESIA:   general  EBL:  10 mL   BLOOD ADMINISTERED:none  DRAINS: none   LOCAL MEDICATIONS USED:  MARCAINE     SPECIMEN:  No Specimen  DISPOSITION OF SPECIMEN:  N/A  COUNTS:  YES  TOURNIQUET:  * Missing  tourniquet times found for documented tourniquets in log: [id number] *  DICTATION: .Dragon Dictation  PLAN OF CARE: Discharge to home after PACU  PATIENT DISPOSITION:  PACU - hemodynamically stable.   Delay start of Pharmacological VTE agent (>24hrs) due to surgical blood loss or risk of bleeding: not applicable

## 2018-12-10 NOTE — Brief Op Note (Signed)
12/10/2018  10:01 AM  PATIENT:  Elizabeth Quinn  48 y.o. female  PRE-OPERATIVE DIAGNOSIS:  torn medial mensicus left knee  POST-OPERATIVE DIAGNOSIS:  ARTHRITIS OF LEFT KNEE  PROCEDURE:  Procedure(s): DIAGNOSTIC KNEE ARTHROSCOPY (Left) -29870  Operative findings mild chondral degeneration of the medial femoral condyle grade 1 no meniscal tears no cruciate ligament tears no chondromalacia in the patella   SURGEON:  Surgeon(s) and Role:    * Kajal Scalici E, MD - Primary  Patient was seen in preop surgical site was confirmed and marked chart review was completed.  She was taken to the operating for general anesthesia she was placed in supine position leg holder was placed on the left leg  After sterile prep and drape and timeout  We established a lateral portal we placed the scope in the joint we did a diagnostic arthroscopy.  We placed a medial portal we placed a probe in the joint and did a second diagnostic arthroscopy.  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  The joint was irrigated and portals were closed with 2, 3-0 nylon sutures laterally one medially  The joint was injected with 60 cc of Marcaine with epinephrine  Sterile bandages Ace bandage and Cryo/Cuff were applied and activated  Patient was taken recovery room in stable condition  PHYSICIAN ASSISTANT:   ASSISTANTS: none   ANESTHESIA:   general  EBL:  10 mL   BLOOD ADMINISTERED:none  DRAINS: none   LOCAL MEDICATIONS USED:  MARCAINE     SPECIMEN:  No Specimen  DISPOSITION OF SPECIMEN:  N/A  COUNTS:  YES  TOURNIQUET:  * Missing  tourniquet times found for documented tourniquets in log: [id number] *  DICTATION: .Dragon Dictation  PLAN OF CARE: Discharge to home after PACU  PATIENT DISPOSITION:  PACU - hemodynamically stable.   Delay start of Pharmacological VTE agent (>24hrs) due to surgical blood loss or risk of bleeding: not applicable  

## 2018-12-11 ENCOUNTER — Encounter (HOSPITAL_COMMUNITY): Payer: Self-pay | Admitting: Emergency Medicine

## 2018-12-11 ENCOUNTER — Other Ambulatory Visit: Payer: Self-pay

## 2018-12-11 ENCOUNTER — Emergency Department (HOSPITAL_COMMUNITY)
Admission: EM | Admit: 2018-12-11 | Discharge: 2018-12-11 | Disposition: A | Discharge status: Home / Self Care | Payer: PRIVATE HEALTH INSURANCE | Attending: Emergency Medicine | Admitting: Emergency Medicine

## 2018-12-11 DIAGNOSIS — Z79899 Other long term (current) drug therapy: Secondary | ICD-10-CM | POA: Insufficient documentation

## 2018-12-11 DIAGNOSIS — I10 Essential (primary) hypertension: Secondary | ICD-10-CM | POA: Diagnosis not present

## 2018-12-11 DIAGNOSIS — F17210 Nicotine dependence, cigarettes, uncomplicated: Secondary | ICD-10-CM | POA: Diagnosis not present

## 2018-12-11 DIAGNOSIS — R58 Hemorrhage, not elsewhere classified: Secondary | ICD-10-CM | POA: Diagnosis not present

## 2018-12-11 DIAGNOSIS — M25562 Pain in left knee: Secondary | ICD-10-CM | POA: Diagnosis present

## 2018-12-11 NOTE — ED Provider Notes (Signed)
[facility] EMERGENCY DEPARTMENT Provider Note   CSN: [id number] Arrival date & time: 12/11/18  0331   Time seen 4:10 AM  History   Chief Complaint Chief Complaint  Patient presents with  . Knee Pain    HPI Elizabeth Quinn is a 48 y.o. female.     HPI patient had arthroscopic surgery on her left knee this morning for torn medial meniscus.  During surgery they found she had mild chondral degeneration of the medial femoral condyle grade 1, no meniscal tears, no cruciate ligament tears, no chondromalacia in the patella.  She states she had gotten up to go to the bathroom about an hour and a half ago and when she came back and laid down on the bed she started having bleeding through her dressing.  She states she has a mild ache but her pain is not bad.  She denies any known injury since her surgery.  She denies history of hemophilia but states that she is always bled easily with minor injuries.  PCP Patient, No Pcp Per   Past Medical History:  Diagnosis Date  . Acid reflux   . Bronchitis   . Hypertension   . Shortness of breath     Patient Active Problem List   Diagnosis Date Noted  . 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 ABLATION    . DILATION AND CURETTAGE OF UTERUS    . INDUCED ABORTION    . MENISCUS REPAIR    . TUBAL LIGATION  17 yrs ago     OB History    Gravida  5   Para      Term      Preterm      AB      Living  3     SAB      TAB      Ectopic      Multiple      Live Births               Home Medications    Prior to Admission medications   Medication Sig Start Date End Date Taking? Authorizing Provider  acetaminophen-codeine (TYLENOL #3) 300-30 MG tablet Take 1 tablet by mouth every 4 (four) hours as needed for up to 7 days for moderate pain. 12/10/18 12/17/18  Vickki Hearing, MD  cyclobenzaprine (FLEXERIL) 10 MG tablet Take 1 tablet (10 mg total) by mouth 2  (two) times daily as needed for muscle spasms. 10/28/18   Vickki Hearing, MD  hydrochlorothiazide (HYDRODIURIL) 12.5 MG tablet Take 1 tablet (12.5 mg total) by mouth daily. 09/08/18   Burgess Amor, PA-C  ibuprofen (ADVIL) 800 MG tablet Take 1 tablet (800 mg total) by mouth every 8 (eight) hours as needed. 12/10/18   Vickki Hearing, MD    Family History Family History  Problem Relation Age of Onset  . Cancer Paternal Grandfather   . Stroke Maternal Grandfather   . Diabetes Mother   . Hypertension Mother   . Anesthesia problems Neg Hx   . Hypotension Neg Hx   . Malignant hyperthermia Neg Hx   . Pseudochol deficiency Neg Hx     Social History Social History   Tobacco Use  . Smoking status: Current Every Day Smoker    Packs/day: 1.00    Years: 21.00    Pack years: 21.00    Types: Cigarettes  . Smokeless tobacco: Never Used  Substance Use Topics  . Alcohol use: Yes    Comment: weekends-drinks 12 pack  . Drug use: No     Allergies   Patient has no known allergies.   Review of Systems Review of Systems  All other systems reviewed and are negative.    Physical Exam Updated Vital Signs BP (!) 156/99 (BP Location: Left Arm)   Pulse 98   Temp 98.1 F (36.7 C) (Oral)   Resp 17   Ht  (1.753 m)   Wt 77.1 kg   SpO2 98%   BMI 25.10 kg/m   Physical Exam Vitals signs and nursing note reviewed.  Constitutional:      Appearance: Normal appearance. She is normal weight.  HENT:     Head: Normocephalic and atraumatic.     Right Ear: External ear normal.     Left Ear: External ear normal.  Eyes:     Extraocular Movements: Extraocular movements intact.     Conjunctiva/sclera: Conjunctivae normal.  Neck:     Musculoskeletal: Normal range of motion.  Cardiovascular:     Rate and Rhythm: Normal rate.  Pulmonary:     Effort: Pulmonary effort is normal. No respiratory distress.  Musculoskeletal:     Comments: Patient has 1 suture placed medially of her left  knee and 2 placed laterally.  Her dressing was soaked with blood from the lateral aspect of her knee.  When it was removed there was no obvious bleeding but at some point there was some mild oozing seen from the inferior hole of the most superior placed suture.  Her skin was cleaned and then she moved her leg and she started having bleeding from that suture again.  When I palpate the knee there is some mild effusion but that does not make the bleeding start.  Skin:    General: Skin is warm and dry.     Findings: No erythema or rash.  Neurological:     General: No focal deficit present.     Mental Status: She is alert and oriented to person, place, and time.     Cranial Nerves: No cranial nerve deficit.  Psychiatric:        Mood and Affect: Mood normal.        Behavior: Behavior normal.        Thought Content: Thought content normal.      ED Treatments / Results  Labs (all labs ordered are listed, but only abnormal results are displayed) Labs Reviewed - No data to display  EKG None  Radiology No results found.  Procedures Procedures (including critical care time)  Medications Ordered in ED Medications - No data to display   Initial Impression / Assessment and Plan / ED Course  I have reviewed the triage vital signs and the nursing notes.  Pertinent labs & imaging results that were available during my care of the patient were reviewed by me and considered in my medical decision making (see chart for details).       Patient's dressing was removed because it was bloodsoaked.  We placed quick clot over the lateral sutures to see if that would help control the bleeding.  Recheck at 5:20 AM there is no bleeding through the dressing with a quick clot.  Nursing staff rewrapped her knee and she was discharged.  Final Clinical Impressions(s) / ED Diagnoses   Final diagnoses:  Bleeding    ED Discharge Orders    None     Plan discharge  Rolland Porter, MD, Barbette Or, MD 12/11/18 [phone]

## 2018-12-11 NOTE — Discharge Instructions (Addendum)
Please follow the instructions Dr. Aline Brochure already gave you.  Keep your follow-up appointments with him.  You can use ice packs for comfort and it will also help slow the bleeding if it should rebleed again.  Recheck if it bleeds a lot again.

## 2018-12-11 NOTE — ED Triage Notes (Signed)
Pt C/O left knee pain and bleeding after meniscus repair. Pt D/C with tylenol #3 however pt did not take any tonight.

## 2018-12-11 NOTE — ED Notes (Signed)
Pt ambulatory with crutches to waiting room. Pt verbalized understanding of discharge instructions.

## 2018-12-12 ENCOUNTER — Encounter (HOSPITAL_COMMUNITY): Payer: Self-pay | Admitting: Emergency Medicine

## 2018-12-12 ENCOUNTER — Emergency Department (HOSPITAL_COMMUNITY)
Admission: EM | Admit: 2018-12-12 | Discharge: 2018-12-12 | Disposition: A | Discharge status: Home / Self Care | Payer: PRIVATE HEALTH INSURANCE | Attending: Emergency Medicine | Admitting: Emergency Medicine

## 2018-12-12 ENCOUNTER — Other Ambulatory Visit: Payer: Self-pay

## 2018-12-12 DIAGNOSIS — M2500 Hemarthrosis, unspecified joint: Secondary | ICD-10-CM

## 2018-12-12 DIAGNOSIS — M25562 Pain in left knee: Secondary | ICD-10-CM | POA: Diagnosis present

## 2018-12-12 DIAGNOSIS — T888XXA Other specified complications of surgical and medical care, not elsewhere classified, initial encounter: Secondary | ICD-10-CM | POA: Insufficient documentation

## 2018-12-12 DIAGNOSIS — I10 Essential (primary) hypertension: Secondary | ICD-10-CM | POA: Insufficient documentation

## 2018-12-12 DIAGNOSIS — F17210 Nicotine dependence, cigarettes, uncomplicated: Secondary | ICD-10-CM | POA: Insufficient documentation

## 2018-12-12 DIAGNOSIS — Y798 Miscellaneous orthopedic devices associated with adverse incidents, not elsewhere classified: Secondary | ICD-10-CM | POA: Insufficient documentation

## 2018-12-12 DIAGNOSIS — M25062 Hemarthrosis, left knee: Secondary | ICD-10-CM | POA: Insufficient documentation

## 2018-12-12 DIAGNOSIS — Z79899 Other long term (current) drug therapy: Secondary | ICD-10-CM | POA: Diagnosis not present

## 2018-12-12 LAB — SYNOVIAL CELL COUNT + DIFF, W/ CRYSTALS
Crystals, Fluid: NONE SEEN
Eosinophils-Synovial: 0 % (ref 0–1)
Lymphocytes-Synovial Fld: 5 % (ref 0–20)
Monocyte-Macrophage-Synovial Fluid: 4 % — ABNORMAL LOW (ref 50–90)
Neutrophil, Synovial: 91 % — ABNORMAL HIGH (ref 0–25)
Other Cells-SYN: 0
WBC, Synovial: 60300 /mm3 — ABNORMAL HIGH (ref 0–200)

## 2018-12-12 LAB — CBC
HCT: 39.5 % (ref 36.0–46.0)
Hemoglobin: 12.6 g/dL (ref 12.0–15.0)
MCH: 27.4 pg (ref 26.0–34.0)
MCHC: 31.9 g/dL (ref 30.0–36.0)
MCV: 85.9 fL (ref 80.0–100.0)
Platelets: 310 10*3/uL (ref 150–400)
RBC: 4.6 MIL/uL (ref 3.87–5.11)
RDW: 14 % (ref 11.5–15.5)
WBC: 10.4 10*3/uL (ref 4.0–10.5)
nRBC: 0 % (ref 0.0–0.2)

## 2018-12-12 LAB — GRAM STAIN
Gram Stain: NONE SEEN
Special Requests: NORMAL

## 2018-12-12 MED ORDER — LIDOCAINE-EPINEPHRINE (PF) 1 %-1:200000 IJ SOLN
10.0000 mL | Freq: Once | INTRAMUSCULAR | Status: AC
  Administered 2018-12-12: 10 mL
  Filled 2018-12-12: qty 30

## 2018-12-12 MED ORDER — HYDROCODONE-ACETAMINOPHEN 5-325 MG PO TABS
2.0000 | ORAL_TABLET | Freq: Once | ORAL | Status: AC
  Administered 2018-12-12: 2 via ORAL
  Filled 2018-12-12: qty 2

## 2018-12-12 NOTE — ED Triage Notes (Signed)
Patient complains of left knee pain. Pt states she had surgery on left knee on Thursday with Dr. Aline Brochure.

## 2018-12-12 NOTE — ED Notes (Signed)
Consent obtained for arhrocentesis.

## 2018-12-12 NOTE — Discharge Instructions (Addendum)
There was signs of blood in your knee - this is somewhat expected after having this type of surgery - this will likely continue to be swollen over the next couple of weeks and sometimes longer - I have discussed your care with Dr. Aline Brochure who agrees to follow up in the office on Monday at 8:30 - there is no obvious infection present, however if you develop any of the following symptoms return immediately to the emergency department  Fever Severe pain Rapidly spreading redness

## 2018-12-12 NOTE — ED Provider Notes (Signed)
[facility] EMERGENCY DEPARTMENT Provider Note   CSN: [id number] Arrival date & time: 12/12/18  1018     History   Chief Complaint Chief Complaint  Patient presents with  . Post-op Problem    HPI Elizabeth Quinn is a 48 y.o. female.     HPI  This patient is a 48 year old female, she presents to the hospital today approximately 2 days after undergoing arthroscopic surgery of her left knee, according to the operative note from 48 hours ago Dr. Romeo Apple had taken this patient to the operating room because of arthritis of the left knee and torn medial meniscus of the left knee.  He found mild chondral degeneration of the medial femoral condyle with no meniscal tears and no cruciate ligament tears and no chondromalacia of the patella.  She presented yesterday because of knee pain, and bleeding.  The bleeding stopped prior to the patient being discharged.  She reports that last night the pain became worse with increased swelling, she feels the pain behind her knee as well.  She also complained of mild numbness to the left foot.  This pain is persistent overnight, she did not call her surgeon prior to coming to the hospital.  She denies associated fevers, she has not had any associated medications for the pain prior to arrival    Past Medical History:  Diagnosis Date  . Acid reflux   . Bronchitis   . Hypertension   . Shortness of breath     Patient Active Problem List   Diagnosis Date Noted  . 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 ABLATION    . DILATION AND CURETTAGE OF UTERUS    . INDUCED ABORTION    . KNEE ARTHROSCOPY Left 12/10/2018   Procedure: DIAGNOSTIC KNEE ARTHROSCOPY;  Surgeon: Vickki Hearing, MD;  Location: [facility];  Service: Orthopedics;  Laterality: Left;  . MENISCUS REPAIR    . TUBAL LIGATION  17 yrs ago     OB History    Gravida  5   Para      Term      Preterm      AB      Living  3     SAB      TAB      Ectopic      Multiple      Live Births               Home Medications    Prior to Admission medications   Medication Sig Start Date End Date Taking? Authorizing Provider  acetaminophen-codeine (TYLENOL #3) 300-30 MG tablet Take 1 tablet by mouth every 4 (four) hours as needed for up to 7 days for moderate pain. 12/10/18 12/17/18 Yes Vickki Hearing, MD  cyclobenzaprine (FLEXERIL) 10 MG tablet Take 1 tablet (10 mg total) by mouth 2 (two) times daily as needed for muscle spasms. 10/28/18  Yes Vickki Hearing, MD  hydrochlorothiazide (HYDRODIURIL) 12.5 MG tablet Take 1 tablet (12.5 mg total) by mouth daily. 09/08/18  Yes Idol, Raynelle Fanning, PA-C  ibuprofen (ADVIL) 800 MG tablet Take 1 tablet (800 mg total) by mouth every 8 (eight) hours as needed. 12/10/18  Yes Vickki Hearing, MD    Family History Family History  Problem Relation Age of Onset  . Cancer Paternal Grandfather   . Stroke Maternal Grandfather   . Diabetes Mother   . Hypertension Mother   .  Anesthesia problems Neg Hx   . Hypotension Neg Hx   . Malignant hyperthermia Neg Hx   . Pseudochol deficiency Neg Hx     Social History Social History   Tobacco Use  . Smoking status: Current Every Day Smoker    Packs/day: 1.00    Years: 21.00    Pack years: 21.00    Types: Cigarettes  . Smokeless tobacco: Never Used  Substance Use Topics  . Alcohol use: Yes    Comment: weekends-drinks 12 pack  . Drug use: No     Allergies   Patient has no known allergies.   Review of Systems Review of Systems  All other systems reviewed and are negative.    Physical Exam Updated Vital Signs BP (!) 144/98 (BP Location: Right Arm)   Pulse 80   Temp 97.8 F (36.6 C) (Oral)   Resp 18   Ht 1.753 m ( )   Wt 77.1 kg   SpO2 100%   BMI 25.10 kg/m   Physical Exam Vitals signs and nursing note reviewed.  Constitutional:      General: She is not in acute distress.     Appearance: She is well-developed.  HENT:     Head: Normocephalic and atraumatic.     Mouth/Throat:     Pharynx: No oropharyngeal exudate.  Eyes:     General: No scleral icterus.       Right eye: No discharge.        Left eye: No discharge.     Conjunctiva/sclera: Conjunctivae normal.     Pupils: Pupils are equal, round, and reactive to light.  Neck:     Musculoskeletal: Normal range of motion and neck supple.     Thyroid: No thyromegaly.     Vascular: No JVD.  Cardiovascular:     Rate and Rhythm: Normal rate and regular rhythm.     Heart sounds: Normal heart sounds. No murmur. No friction rub. No gallop.   Pulmonary:     Effort: Pulmonary effort is normal. No respiratory distress.     Breath sounds: Normal breath sounds. No wheezing or rales.  Abdominal:     General: Bowel sounds are normal. There is no distension.     Palpations: Abdomen is soft. There is no mass.     Tenderness: There is no abdominal tenderness.  Musculoskeletal: Normal range of motion.        General: Swelling and tenderness present.     Comments: There are postoperative wounds which have been sutured of the knee, the surgical wounds appear clean and intact, the lateral wound appears to have a small amount of redness around it.  There is a moderate to large joint effusion, she has significant decreased range of motion of the knee secondary to pain  Lymphadenopathy:     Cervical: No cervical adenopathy.  Skin:    General: Skin is warm and dry.     Findings: Erythema present. No rash.  Neurological:     Mental Status: She is alert.     Coordination: Coordination normal.     Comments: The patient has totally normal sensation and strength distal to the left knee  Psychiatric:        Behavior: Behavior normal.      ED Treatments / Results  Labs (all labs ordered are listed, but only abnormal results are displayed) Labs Reviewed  GRAM STAIN  CBC  GLUCOSE, BODY FLUID OTHER  PROTEIN, BODY FLUID  (OTHER)  SYNOVIAL CELL COUNT +  DIFF, W/ CRYSTALS    EKG None  Radiology No results found.  Procedures .Joint Aspiration/Arthrocentesis  Date/Time: 12/12/2018 11:49 AM Performed by: Noemi Chapel, MD Authorized by: Noemi Chapel, MD   Consent:    Consent obtained:  Written and verbal   Consent given by:  Patient   Risks discussed:  Bleeding, infection, pain, incomplete drainage and nerve damage   Alternatives discussed:  No treatment Location:    Location:  Knee   Knee:  L knee Anesthesia (see MAR for exact dosages):    Anesthesia method:  Local infiltration   Local anesthetic:  Lidocaine 1% WITH epi Procedure details:    Preparation: Patient was prepped and draped in usual sterile fashion     Needle gauge:  18 G   Ultrasound guidance: no     Approach:  Lateral   Aspirate amount:  40   Aspirate characteristics:  Bloody   Steroid injected: no     Specimen collected: yes   Post-procedure details:    Dressing:  Sterile dressing and gauze roll   Patient tolerance of procedure:  Tolerated well, no immediate complications Comments:         (including critical care time)  Medications Ordered in ED Medications  HYDROcodone-acetaminophen (NORCO/VICODIN) 5-325 MG per tablet 2 tablet (2 tablets Oral Given 12/12/18 1044)  lidocaine-EPINEPHrine (XYLOCAINE-EPINEPHrine) 1 %-1:200000 (PF) injection 10 mL (10 mLs Other Given by Other 12/12/18 1048)     Initial Impression / Assessment and Plan / ED Course  I have reviewed the triage vital signs and the nursing notes.  Pertinent labs & imaging results that were available during my care of the patient were reviewed by me and considered in my medical decision making (see chart for details).  Clinical Course as of Dec 12 1207  Sat Dec 12, 2018  1054 Discussed the case with consulting orthopedist Dr. Arther Abbott who agrees with arthrocentesis   [BM]    Clinical Course User Index [BM] Noemi Chapel, MD      The exam is  concerning for joint effusion which could be hemarthrosis or infection as possible causes.  48 hours out from arthroscopic surgery.  Will discuss with surgeon, anticipate possible arthrocentesis if he advises, the patient is afebrile and not tachycardic.  Pain medications have been ordered.  Dr. Aline Brochure was made aware of the arthrocentesis findings of hemarthrosis, CBC shows no leukocytosis, normal hemoglobin, the patient's vital signs are reassuring without fever or tachycardia and the patient was made aware of the indications for return, she is agreeable, Dr. Aline Brochure will see her Monday morning at 830.   Final Clinical Impressions(s) / ED Diagnoses   Final diagnoses:  Hemarthrosis following procedure, initial encounter    ED Discharge Orders    None       Noemi Chapel, MD 12/12/18 1209

## 2018-12-13 LAB — GLUCOSE, BODY FLUID OTHER: Glucose, Body Fluid Other: 2 mg/dL

## 2018-12-13 LAB — PROTEIN, BODY FLUID (OTHER): Total Protein, Body Fluid Other: 7 g/dL

## 2018-12-14 ENCOUNTER — Ambulatory Visit (INDEPENDENT_AMBULATORY_CARE_PROVIDER_SITE_OTHER): Payer: PRIVATE HEALTH INSURANCE | Admitting: Orthopedic Surgery

## 2018-12-14 ENCOUNTER — Other Ambulatory Visit: Payer: Self-pay

## 2018-12-14 ENCOUNTER — Encounter: Payer: Self-pay | Admitting: Orthopedic Surgery

## 2018-12-14 DIAGNOSIS — Z9889 Other specified postprocedural states: Secondary | ICD-10-CM

## 2018-12-14 MED ORDER — SULFAMETHOXAZOLE-TRIMETHOPRIM 400-80 MG PO TABS: 1.0000 | ORAL_TABLET | Freq: Two times a day (BID) | ORAL | 0 refills | Status: DC

## 2018-12-14 MED ORDER — ACETAMINOPHEN-CODEINE #3 300-30 MG PO TABS: 1.0000 | ORAL_TABLET | ORAL | 0 refills | Status: AC | PRN

## 2018-12-14 NOTE — Patient Instructions (Addendum)
Ice 6 x a day   Take oral antibiotics bactrim   Take ibuprofen and tylenol with codeine for pain  Use the crutches until you come back   Change dressing

## 2018-12-14 NOTE — Progress Notes (Signed)
Chief Complaint  Patient presents with  . Post-op Problem    increased swelling s/p knee scope on  12/10/2018    Status post knee arthroscopy on the 15th went to the ER twice presents with history of hemarthrosis as noted in the ER from tap of the knee knee looks swollen there are some areas of erythema sutures were removed  She is ambulatory with crutches partial weightbearing  Recommend ice 6 times a day  Crutches  Bactrim to prevent any infection Read Hydrocodone

## 2018-12-17 LAB — CULTURE, BODY FLUID W GRAM STAIN -BOTTLE: Culture: NO GROWTH

## 2018-12-21 ENCOUNTER — Encounter: Payer: Self-pay | Admitting: Orthopedic Surgery

## 2018-12-21 ENCOUNTER — Other Ambulatory Visit: Payer: Self-pay

## 2018-12-21 ENCOUNTER — Ambulatory Visit (INDEPENDENT_AMBULATORY_CARE_PROVIDER_SITE_OTHER): Payer: PRIVATE HEALTH INSURANCE | Admitting: Orthopedic Surgery

## 2018-12-21 VITALS — BP 133/88 | HR 85 | Temp 97.4°F | Ht 69.0 in

## 2018-12-21 DIAGNOSIS — M2500 Hemarthrosis, unspecified joint: Secondary | ICD-10-CM

## 2018-12-21 DIAGNOSIS — Z9889 Other specified postprocedural states: Secondary | ICD-10-CM

## 2018-12-21 DIAGNOSIS — T888XXD Other specified complications of surgical and medical care, not elsewhere classified, subsequent encounter: Secondary | ICD-10-CM

## 2018-12-21 NOTE — Patient Instructions (Signed)
Ms. Norbeck you have had excessive bleeding into the knee joint after surgery which is a rare complication  We want you to continue to ice the knee to keep the swelling down try to bend the knee to keep it from stiffening continue your Bactrim to prevent infection the Advil and codeine for pain  Follow-up in 2 weeks

## 2018-12-21 NOTE — Progress Notes (Signed)
Chief Complaint  Patient presents with  . Follow-up    Recheck on left knee, DOS 12-10-18.    Patient developed a postop hematoma comes in with swelling and stiffness of the left knee  She is on prophylactic Bactrim and Advil Tylenol with codeine and then HydroDIURIL for blood pressure and Flexeril for back pain  We aspirated 15 cc of amber-colored fluid from the left knee  She will continue bending the knee at home use ice for swelling continue her medications with hydrocodone and Advil and Bactrim  The redness we saw is improving  She is using 1 crutch for ambulation  Follow-up in 2 weeks

## 2018-12-31 ENCOUNTER — Telehealth: Payer: Self-pay | Admitting: Orthopedic Surgery

## 2018-12-31 NOTE — Telephone Encounter (Signed)
Elizabeth Quinn called this afternoon stating that she has an appointment on Monday but wanted to know if she could in to see the doctor today instead.  I told her that I did not have a doctor at all until Monday when Dr Aline Brochure returns.  She asked if she could go to the ER.  I asked her what was going on and she said that her knee was swollen and that it just hurt.  I told her that I could not advise her that she would need to do whatever she felt best for her.  I did ask her though if she went to ER, Urgent Care or her PCP to let me know so that Dr. Aline Brochure would have that information when she came in on Monday.  She said she would do this.

## 2019-01-04 ENCOUNTER — Telehealth: Payer: Self-pay | Admitting: Orthopedic Surgery

## 2019-01-04 ENCOUNTER — Ambulatory Visit (INDEPENDENT_AMBULATORY_CARE_PROVIDER_SITE_OTHER): Payer: PRIVATE HEALTH INSURANCE | Admitting: Orthopedic Surgery

## 2019-01-04 ENCOUNTER — Encounter: Payer: Self-pay | Admitting: Orthopedic Surgery

## 2019-01-04 ENCOUNTER — Other Ambulatory Visit: Payer: Self-pay

## 2019-01-04 DIAGNOSIS — Z9889 Other specified postprocedural states: Secondary | ICD-10-CM

## 2019-01-04 DIAGNOSIS — M5442 Lumbago with sciatica, left side: Secondary | ICD-10-CM

## 2019-01-04 DIAGNOSIS — M2500 Hemarthrosis, unspecified joint: Secondary | ICD-10-CM

## 2019-01-04 DIAGNOSIS — T888XXD Other specified complications of surgical and medical care, not elsewhere classified, subsequent encounter: Secondary | ICD-10-CM

## 2019-01-04 DIAGNOSIS — G8929 Other chronic pain: Secondary | ICD-10-CM

## 2019-01-04 MED ORDER — PREDNISONE 10 MG (48) PO TBPK: ORAL_TABLET | Freq: Every day | ORAL | 0 refills | Status: DC

## 2019-01-04 NOTE — Progress Notes (Signed)
Chief Complaint  Patient presents with  . Routine Post Op    12/10/2018 left knee arthroscopy, improving    Status post arthroscopy left knee //complicated by hemarthrosis  Patient was put on Bactrim to prevent any infection  limping has caused her to have recurrent sciatica.  MRI did not show compressive neuropathy mild arthritis  Left knee small effusion flexion 95 degrees full extension walking without support but limping  Recommend stop Advil Start prednisone  Start or resume gabapentin at a higher dose depending on the phone call that she will give [place] to let us know what the dose was before when it did not work  She had an MRI already as stated  Follow-up 2 weeks

## 2019-01-04 NOTE — Telephone Encounter (Signed)
Per voice message left 11:22am - patient requests to speak with Amy. Ph [phone].

## 2019-01-04 NOTE — Telephone Encounter (Signed)
The Gabapentin she had previously was 300 mg once daily , she states it did not help

## 2019-01-04 NOTE — Patient Instructions (Addendum)
Stop advil  Start  prednisone   Continue the knee exercises

## 2019-01-05 NOTE — Telephone Encounter (Signed)
INCREASE TO 300 MG TID

## 2019-01-14 DIAGNOSIS — Z9889 Other specified postprocedural states: Secondary | ICD-10-CM | POA: Insufficient documentation

## 2019-01-18 ENCOUNTER — Ambulatory Visit: Payer: PRIVATE HEALTH INSURANCE | Admitting: Orthopedic Surgery

## 2019-01-20 ENCOUNTER — Ambulatory Visit (INDEPENDENT_AMBULATORY_CARE_PROVIDER_SITE_OTHER): Payer: PRIVATE HEALTH INSURANCE | Admitting: Orthopedic Surgery

## 2019-01-20 ENCOUNTER — Other Ambulatory Visit: Payer: Self-pay

## 2019-01-20 ENCOUNTER — Encounter: Payer: Self-pay | Admitting: Orthopedic Surgery

## 2019-01-20 VITALS — BP 132/64 | HR 87 | Temp 96.8°F | Ht 69.0 in | Wt 168.0 lb

## 2019-01-20 DIAGNOSIS — Z9889 Other specified postprocedural states: Secondary | ICD-10-CM | POA: Diagnosis not present

## 2019-01-20 DIAGNOSIS — M5410 Radiculopathy, site unspecified: Secondary | ICD-10-CM | POA: Diagnosis not present

## 2019-01-20 MED ORDER — GABAPENTIN 100 MG PO CAPS: 100.0000 mg | ORAL_CAPSULE | Freq: Three times a day (TID) | ORAL | 2 refills | Status: DC

## 2019-01-20 NOTE — Patient Instructions (Signed)
Start gabapentin 100 mg 3 times a day   And ibuprofen over the counter 800mg  3 times a day

## 2019-01-20 NOTE — Progress Notes (Signed)
Chief Complaint  Patient presents with  . Follow-up    Recheck on left knee, DOS 12-10-18.   Elizabeth Quinn had surgery arthroscopy on the left knee we found some mild grade I chondromalacia medial femoral condyle confirming that her pain is extra-articular.  She had an MRI did not show any acute compressive neuropathy just L2-3 facet arthritis  She continues to complain of pain behind the leg and it radiates into the thigh and foot  Review of systems negative for fever weight loss chills bowel bladder dysfunction  Left knee shows slight decrease in overall extension she can flex knee about 125 degrees with pain she has tenderness in the back of the thigh knee and calf down to the leg  She does not exhibit any neurovascular discomfort or changes  Gait shows a slight limp as stated  BP 132/64   Pulse 87   Temp (!) 96.8 F (36 C)   Ht 5\' 9"  (1.753 m)   Wt 168 lb (76.2 kg)   BMI 24.81 kg/m     Recommend gabapentin 100 mg 3 times a day increase dose up to 4 times a day if needed  Follow-up in 1 month  Encounter Diagnoses  Name Primary?  . S/P arthroscopy of left knee 12/10/18 left knee    . Radicular syndrome of left leg Yes      Current Outpatient Medications:  .  cyclobenzaprine (FLEXERIL) 10 MG tablet, Take 1 tablet (10 mg total) by mouth 2 (two) times daily as needed for muscle spasms., Disp: 40 tablet, Rfl: 1 .  hydrochlorothiazide (HYDRODIURIL) 12.5 MG tablet, Take 1 tablet (12.5 mg total) by mouth daily., Disp: 30 tablet, Rfl: 0 .  ibuprofen (ADVIL) 800 MG tablet, Take 1 tablet (800 mg total) by mouth every 8 (eight) hours as needed., Disp: 90 tablet, Rfl: 1 .  predniSONE (STERAPRED UNI-PAK 48 TAB) 10 MG (48) TBPK tablet, Take by mouth daily., Disp: 48 tablet, Rfl: 0 .  sulfamethoxazole-trimethoprim (BACTRIM) 400-80 MG tablet, Take 1 tablet by mouth 2 (two) times daily., Disp: 28 tablet, Rfl: 0

## 2019-02-16 ENCOUNTER — Other Ambulatory Visit: Payer: PRIVATE HEALTH INSURANCE

## 2019-02-24 ENCOUNTER — Encounter: Payer: Self-pay | Admitting: Orthopedic Surgery

## 2019-02-24 ENCOUNTER — Ambulatory Visit (INDEPENDENT_AMBULATORY_CARE_PROVIDER_SITE_OTHER): Payer: PRIVATE HEALTH INSURANCE | Admitting: Orthopedic Surgery

## 2019-02-24 ENCOUNTER — Other Ambulatory Visit: Payer: Self-pay

## 2019-02-24 VITALS — BP 150/94 | HR 90 | Ht 69.0 in | Wt 165.0 lb

## 2019-02-24 DIAGNOSIS — Z9889 Other specified postprocedural states: Secondary | ICD-10-CM

## 2019-02-24 DIAGNOSIS — M5410 Radiculopathy, site unspecified: Secondary | ICD-10-CM

## 2019-02-24 NOTE — Patient Instructions (Signed)
Continue gabapentin and Flexeril call us if you need a refill  Otherwise follow-up as needed

## 2019-02-24 NOTE — Progress Notes (Signed)
Chief Complaint  Patient presents with  . Back Pain    feeling better     Knee scope December 19, 1763  48 year old female who had arthroscopy of the left knee after MRI of the lumbar spine showed L2-L3 facet arthritis and MRI of the knee showed chondromalacia in the patella this was confirmed at arthroscopy with just grade I chondromalacia of the medial femoral condyle and it seemed like her pain was extra-articular however she was still having pain behind the leg it radiated from the thigh and hip down to the foot  She is now on 300 mg of gabapentin 3 times a day and says that her knee finally feels better and she just has pain when she sits for a long time in the back of the knee  Recommend continue gabapentin and Flexeril call for refills otherwise follow-up as needed medication listed below  Outpatient Encounter Medications as of 02/24/2019  Medication Sig Note  . cyclobenzaprine (FLEXERIL) 10 MG tablet Take 1 tablet (10 mg total) by mouth 2 (two) times daily as needed for muscle spasms.   Marland Kitchen gabapentin (NEURONTIN) 300 MG capsule Take 300 mg by mouth 3 (three) times daily.   . [DISCONTINUED] gabapentin (NEURONTIN) 100 MG capsule Take 1 capsule (100 mg total) by mouth 3 (three) times daily.   . hydrochlorothiazide (HYDRODIURIL) 12.5 MG tablet Take 1 tablet (12.5 mg total) by mouth daily. (Patient not taking: Reported on 02/24/2019) 12/12/2018: Patient not compliant   . ibuprofen (ADVIL) 800 MG tablet Take 1 tablet (800 mg total) by mouth every 8 (eight) hours as needed. (Patient not taking: Reported on 02/24/2019)   . [DISCONTINUED] predniSONE (STERAPRED UNI-PAK 48 TAB) 10 MG (48) TBPK tablet Take by mouth daily.   . [DISCONTINUED] sulfamethoxazole-trimethoprim (BACTRIM) 400-80 MG tablet Take 1 tablet by mouth 2 (two) times daily.    No facility-administered encounter medications on file as of 02/24/2019.   Encounter Diagnoses  Name Primary?  . S/P arthroscopy of left knee 12/10/18 left  knee  Yes  . Radicular syndrome of left leg      Fu prn

## 2019-09-02 ENCOUNTER — Ambulatory Visit: Payer: 59 | Admitting: Orthopedic Surgery

## 2019-09-02 ENCOUNTER — Encounter: Payer: Self-pay | Admitting: Orthopedic Surgery

## 2019-09-02 ENCOUNTER — Other Ambulatory Visit: Payer: Self-pay

## 2019-09-02 VITALS — BP 159/115 | HR 80 | Ht 69.0 in | Wt 170.0 lb

## 2019-09-02 DIAGNOSIS — M5410 Radiculopathy, site unspecified: Secondary | ICD-10-CM | POA: Diagnosis not present

## 2019-09-02 MED ORDER — NORTRIPTYLINE HCL 10 MG PO CAPS: 10.0000 mg | ORAL_CAPSULE | Freq: Every day | ORAL | 1 refills | Status: AC

## 2019-09-02 MED ORDER — MELOXICAM 7.5 MG PO TABS: 7.5000 mg | ORAL_TABLET | Freq: Every day | ORAL | 5 refills | Status: AC

## 2019-09-02 MED ORDER — METHOCARBAMOL 500 MG PO TABS: 500.0000 mg | ORAL_TABLET | Freq: Three times a day (TID) | ORAL | 1 refills | Status: AC

## 2019-09-02 NOTE — Progress Notes (Signed)
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    Dispense:  60 tablet    Refill:  1   nortriptyline (PAMELOR) 10 MG capsule    Sig: Take 1 capsule (10 mg total) by mouth at bedtime.    Dispense:  30 capsule    Refill:  1   meloxicam (MOBIC) 7.5 MG tablet    Sig: Take 1 tablet (7.5 mg total) by mouth daily.    Dispense:  30 tablet    Refill:  5

## 2019-09-02 NOTE — Patient Instructions (Signed)
Start new medications   Meds ordered this encounter  Medications  . methocarbamol (ROBAXIN) 500 MG tablet    Sig: Take 1 tablet (500 mg total) by mouth 3 (three) times daily.    Dispense:  60 tablet    Refill:  1  . nortriptyline (PAMELOR) 10 MG capsule    Sig: Take 1 capsule (10 mg total) by mouth at bedtime.    Dispense:  30 capsule    Refill:  1  . meloxicam (MOBIC) 7.5 MG tablet    Sig: Take 1 tablet (7.5 mg total) by mouth daily.    Dispense:  30 tablet    Refill:  5

## 2019-09-02 NOTE — Progress Notes (Signed)
Chief Complaint  Patient presents with  . Knee Pain    left  . Sciatica    left

## 2019-09-03 ENCOUNTER — Telehealth: Payer: Self-pay | Admitting: Radiology

## 2019-09-03 ENCOUNTER — Telehealth: Payer: Self-pay | Admitting: Nurse Practitioner

## 2019-09-03 DIAGNOSIS — G8929 Other chronic pain: Secondary | ICD-10-CM

## 2019-09-03 DIAGNOSIS — M5410 Radiculopathy, site unspecified: Secondary | ICD-10-CM

## 2019-09-03 NOTE — Telephone Encounter (Signed)
Phone call to patient to verify medication list and allergies for myelogram procedure. Pt instructed to hold nortriptyline for 48hrs prior to myelogram appointment time. Pt verbalized understanding. Pre and post procedure instructions reviewed with pt.

## 2019-09-03 NOTE — Telephone Encounter (Signed)
Have put in the order.

## 2019-09-03 NOTE — Telephone Encounter (Signed)
-----   Message from Vickki Hearing, MD sent at 09/02/2019  4:53 PM EDT ----- Needs a CT myelogram

## 2019-09-13 ENCOUNTER — Inpatient Hospital Stay
Admission: RE | Admit: 2019-09-13 | Discharge: 2019-09-13 | Disposition: A | Discharge status: Home / Self Care | Payer: 59 | Source: Ambulatory Visit | Attending: Orthopedic Surgery | Admitting: Orthopedic Surgery

## 2019-09-13 ENCOUNTER — Other Ambulatory Visit: Payer: 59

## 2019-09-13 NOTE — Discharge Instructions (Signed)
Myelogram Discharge Instructions  1. Go home and rest quietly for the next 24 hours.  It is important to lie flat for the next 24 hours.  Get up only to go to the restroom.  You may lie in the bed or on a couch on your back, your stomach, your left side or your right side.  You may have one pillow under your head.  You may have pillows between your knees while you are on your side or under your knees while you are on your back.  2. DO NOT drive today.  Recline the seat as far back as it will go, while still wearing your seat belt, on the way home.  3. You may get up to go to the bathroom as needed.  You may sit up for 10 minutes to eat.  You may resume your normal diet and medications unless otherwise indicated.  Drink plenty of extra fluids today and tomorrow.  4. The incidence of a spinal headache with nausea and/or vomiting is about 5% (one in 20 patients).  If you develop a headache, lie flat and drink plenty of fluids until the headache goes away.  Caffeinated beverages may be helpful.  If you develop severe nausea and vomiting or a headache that does not go away with flat bed rest, call [phone].  5. You may resume normal activities after your 24 hours of bed rest is over; however, do not exert yourself strongly or do any heavy lifting tomorrow.  6. Call your physician for a follow-up appointment.    You may resume Nortriptyline on Tuesday, September 14, 2019 after 9:30a.m.

## 2019-09-21 ENCOUNTER — Other Ambulatory Visit: Payer: 59

## 2019-09-21 ENCOUNTER — Inpatient Hospital Stay
Admission: RE | Admit: 2019-09-21 | Discharge: 2019-09-21 | Disposition: A | Discharge status: Home / Self Care | Payer: 59 | Source: Ambulatory Visit | Attending: Orthopedic Surgery | Admitting: Orthopedic Surgery

## 2019-09-21 NOTE — Discharge Instructions (Signed)

## 2019-09-28 ENCOUNTER — Inpatient Hospital Stay: Admission: RE | Admit: 2019-09-28 | Payer: 59 | Source: Ambulatory Visit

## 2019-09-28 ENCOUNTER — Other Ambulatory Visit: Payer: 59

## 2019-10-04 ENCOUNTER — Telehealth: Payer: Self-pay | Admitting: Radiology

## 2019-10-04 ENCOUNTER — Inpatient Hospital Stay
Admission: RE | Admit: 2019-10-04 | Discharge: 2019-10-04 | Disposition: A | Discharge status: Home / Self Care | Payer: 59 | Source: Ambulatory Visit | Attending: Orthopedic Surgery | Admitting: Orthopedic Surgery

## 2019-10-04 ENCOUNTER — Other Ambulatory Visit: Payer: 59

## 2019-10-04 NOTE — Telephone Encounter (Addendum)
Patient has RS the CT Myelogram x 3 then no showed today I called her to see if she is going to [facility]  Voice mail not setup  Jenel Lucks has also left messages for her to call them back and let them know if she plans to [facility]   Patient has not returned calls to [facility] Myelogram/ CT  I am closing referral  To you FYI

## 2019-10-04 NOTE — Discharge Instructions (Signed)
Myelogram Discharge Instructions  1. Go home and rest quietly for the next 24 hours.  It is important to lie flat for the next 24 hours.  Get up only to go to the restroom.  You may lie in the bed or on a couch on your back, your stomach, your left side or your right side.  You may have one pillow under your head.  You may have pillows between your knees while you are on your side or under your knees while you are on your back.  2. DO NOT drive today.  Recline the seat as far back as it will go, while still wearing your seat belt, on the way home.  3. You may get up to go to the bathroom as needed.  You may sit up for 10 minutes to eat.  You may resume your normal diet and medications unless otherwise indicated.  Drink plenty of extra fluids today and tomorrow.  4. The incidence of a spinal headache with nausea and/or vomiting is about 5% (one in 20 patients).  If you develop a headache, lie flat and drink plenty of fluids until the headache goes away.  Caffeinated beverages may be helpful.  If you develop severe nausea and vomiting or a headache that does not go away with flat bed rest, call [phone].  5. You may resume normal activities after your 24 hours of bed rest is over; however, do not exert yourself strongly or do any heavy lifting tomorrow.  6. Call your physician for a follow-up appointment.    You may resume Nortriptyline on Tuesday, October 05, 2019, after 9:30a.m.

## 2019-10-11 ENCOUNTER — Ambulatory Visit
Admission: RE | Admit: 2019-10-11 | Discharge: 2019-10-11 | Disposition: A | Discharge status: Home / Self Care | Payer: 59 | Source: Ambulatory Visit | Attending: Orthopedic Surgery | Admitting: Orthopedic Surgery

## 2019-10-11 DIAGNOSIS — M5410 Radiculopathy, site unspecified: Secondary | ICD-10-CM

## 2019-10-11 DIAGNOSIS — G8929 Other chronic pain: Secondary | ICD-10-CM

## 2019-10-11 DIAGNOSIS — M5442 Lumbago with sciatica, left side: Secondary | ICD-10-CM

## 2019-10-11 MED ORDER — DIAZEPAM 5 MG PO TABS
10.0000 mg | ORAL_TABLET | Freq: Once | ORAL | Status: AC
  Administered 2019-10-11: 5 mg via ORAL

## 2019-10-11 MED ORDER — IOPAMIDOL (ISOVUE-M 200) INJECTION 41%
18.0000 mL | Freq: Once | INTRAMUSCULAR | Status: AC
  Administered 2019-10-11: 18 mL via INTRATHECAL

## 2019-10-11 NOTE — Progress Notes (Signed)
Patient states she has been off Nortriptyline for at least the past two days.

## 2019-10-11 NOTE — Discharge Instructions (Signed)

## 2019-10-19 ENCOUNTER — Telehealth: Payer: Self-pay | Admitting: Orthopedic Surgery

## 2019-10-19 DIAGNOSIS — M5410 Radiculopathy, site unspecified: Secondary | ICD-10-CM

## 2019-10-19 NOTE — Telephone Encounter (Signed)
IMPRESSION: 1. 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 This is the most likely etiology for a left S1 radiculopathy. 2. Mild disc bulging at L4-5 and L5-S1 without significant stenosis. 3. Moderate facet hypertrophy at L2-3 without significant stenosis.

## 2019-10-19 NOTE — Telephone Encounter (Signed)
Patient called to see if Dr. Romeo Apple has read the test results from the CT.

## 2019-10-20 NOTE — Telephone Encounter (Signed)
-----   Message from Vickki Hearing, MD sent at 10/20/2019  2:28 PM EDT ----- Called patient gave results.  Set up neurosurgical referral.

## 2019-11-08 ENCOUNTER — Ambulatory Visit: Payer: 59 | Admitting: Adult Health

## 2019-11-22 ENCOUNTER — Ambulatory Visit: Payer: 59 | Admitting: Orthopedic Surgery

## 2019-12-13 ENCOUNTER — Telehealth: Payer: Self-pay | Admitting: Orthopedic Surgery

## 2019-12-13 NOTE — Telephone Encounter (Signed)
Patient had previously brought in a pre-employment form from [employer].  She requests that Dr Romeo Apple provide his opinion for her to perform the job duties required for position for which she is applying. Called back to patient to notify that Dr Romeo Apple indicated his opinion, and paperwork is ready to pick up. Copy made for scanning.

## 2020-03-24 ENCOUNTER — Encounter: Payer: Self-pay | Admitting: Orthopedic Surgery

## 2020-04-18 ENCOUNTER — Other Ambulatory Visit: Payer: Self-pay

## 2020-04-18 ENCOUNTER — Emergency Department (HOSPITAL_COMMUNITY): Payer: Self-pay

## 2020-04-18 ENCOUNTER — Emergency Department (HOSPITAL_COMMUNITY)
Admission: EM | Admit: 2020-04-18 | Discharge: 2020-04-18 | Disposition: A | Discharge status: Home / Self Care | Payer: Self-pay | Attending: Emergency Medicine | Admitting: Emergency Medicine

## 2020-04-18 ENCOUNTER — Encounter (HOSPITAL_COMMUNITY): Payer: Self-pay | Admitting: Emergency Medicine

## 2020-04-18 DIAGNOSIS — R0602 Shortness of breath: Secondary | ICD-10-CM | POA: Insufficient documentation

## 2020-04-18 DIAGNOSIS — F17210 Nicotine dependence, cigarettes, uncomplicated: Secondary | ICD-10-CM | POA: Insufficient documentation

## 2020-04-18 DIAGNOSIS — Z79899 Other long term (current) drug therapy: Secondary | ICD-10-CM | POA: Insufficient documentation

## 2020-04-18 DIAGNOSIS — I10 Essential (primary) hypertension: Secondary | ICD-10-CM | POA: Insufficient documentation

## 2020-04-18 LAB — CBC
HCT: 38.7 % (ref 36.0–46.0)
Hemoglobin: 13 g/dL (ref 12.0–15.0)
MCH: 28.1 pg (ref 26.0–34.0)
MCHC: 33.6 g/dL (ref 30.0–36.0)
MCV: 83.8 fL (ref 80.0–100.0)
Platelets: 278 10*3/uL (ref 150–400)
RBC: 4.62 MIL/uL (ref 3.87–5.11)
RDW: 13.2 % (ref 11.5–15.5)
WBC: 7.2 10*3/uL (ref 4.0–10.5)
nRBC: 0 % (ref 0.0–0.2)

## 2020-04-18 LAB — BASIC METABOLIC PANEL
Anion gap: 6 (ref 5–15)
BUN: 10 mg/dL (ref 6–20)
CO2: 25 mmol/L (ref 22–32)
Calcium: 9.2 mg/dL (ref 8.9–10.3)
Chloride: 107 mmol/L (ref 98–111)
Creatinine, Ser: 0.74 mg/dL (ref 0.44–1.00)
GFR, Estimated: >60 mL/min (ref >60)
Glucose, Bld: 98 mg/dL (ref 70–99)
Potassium: 3.8 mmol/L (ref 3.5–5.1)
Sodium: 138 mmol/L (ref 135–145)

## 2020-04-18 LAB — BRAIN NATRIURETIC PEPTIDE: B Natriuretic Peptide: 21 pg/mL (ref 0.0–100.0)

## 2020-04-18 LAB — TROPONIN I (HIGH SENSITIVITY): Troponin I (High Sensitivity): 3 ng/L (ref <18)

## 2020-04-18 MED ORDER — PANTOPRAZOLE SODIUM 20 MG PO TBEC: 20.0000 mg | DELAYED_RELEASE_TABLET | Freq: Two times a day (BID) | ORAL | 1 refills | Status: AC

## 2020-04-18 MED ORDER — ASPIRIN 81 MG PO CHEW
324.0000 mg | CHEWABLE_TABLET | Freq: Once | ORAL | Status: AC
  Administered 2020-04-18: 324 mg via ORAL
  Filled 2020-04-18: qty 4

## 2020-04-18 MED ORDER — HYDROCHLOROTHIAZIDE 12.5 MG PO TABS
12.5000 mg | ORAL_TABLET | Freq: Every day | ORAL | 1 refills | Status: DC
Start: 2020-04-18 — End: 2021-10-30

## 2020-04-18 NOTE — ED Notes (Signed)
ED Provider at bedside. 

## 2020-04-18 NOTE — ED Provider Notes (Signed)
[facility] EMERGENCY DEPARTMENT Provider Note   CSN: [id number] Arrival date & time: 04/18/20  0865     History Chief Complaint  Patient presents with  . Chest Pain  . Shortness of Breath    Elizabeth Quinn is a 50 y.o. female.  HPI Patient states she has been having issues with tightness in her chest ongoing for the last several months.  She feels like when she lies flat she feels like things are pushing up towards her chest and it feels tight.  It goes into her neck as well.  It only occurs when she is lying down.  It does not occur when she is up and walking around or standing.  She has not had any pain in her chest.  She does not feel like she has had any swelling in her legs or arms.  She denies any fevers or chills.  She is not coughing.  Patient has not seen anyone for this since it started.  In the last few days it seems to have gotten worse and she has not slept well so she came to the ED.  Patient is post be on blood pressure medications but ran out about a month or 2 before the symptoms started HPI: A 50 year old patient with a history of hypertension presents for evaluation of chest pain. Initial onset of pain was more than 6 hours ago. The patient's chest pain is described as heaviness/pressure/tightness and is not worse with exertion. The patient's chest pain is not middle- or left-sided, is not well-localized, is not sharp and does radiate to the arms/jaw/neck. The patient does not complain of nausea and denies diaphoresis. The patient has smoked in the past 90 days. The patient has no history of stroke, has no history of peripheral artery disease, denies any history of treated diabetes, has no relevant family history of coronary artery disease (first degree relative at less than age 42), has no history of hypercholesterolemia and does not have an elevated BMI (>=30).   Past Medical History:  Diagnosis Date  . Acid reflux   . Bronchitis   . Hypertension   . Shortness of  breath     Patient Active Problem List   Diagnosis Date Noted  . S/P arthroscopy of left knee 12/10/18 left knee  01/14/2019  . 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 ABLATION    . DILATION AND CURETTAGE OF UTERUS    . INDUCED ABORTION    . KNEE ARTHROSCOPY Left 12/10/2018   Procedure: DIAGNOSTIC KNEE ARTHROSCOPY;  Surgeon: Vickki Hearing, MD;  Location: [facility];  Service: Orthopedics;  Laterality: Left;  . MENISCUS REPAIR    . TUBAL LIGATION  17 yrs ago     OB History    Gravida  5   Para  0   Term  0   Preterm  0   AB  0   Living        SAB  0   IAB  0   Ectopic  0   Multiple      Live Births              Family History  Problem Relation Age of Onset  . Cancer Paternal Grandfather   . Stroke Maternal Grandfather   . Diabetes Mother   . Hypertension Mother   . Anesthesia problems Neg Hx   . Hypotension Neg Hx   .  Malignant hyperthermia Neg Hx   . Pseudochol deficiency Neg Hx     Social History   Tobacco Use  . Smoking status: Current Every Day Smoker    Packs/day: 1.00    Years: 21.00    Pack years: 21.00    Types: Cigarettes  . Smokeless tobacco: Never Used  Vaping Use  . Vaping Use: Never used  Substance Use Topics  . Alcohol use: Yes    Comment: weekends-drinks 12 pack  . Drug use: No    Home Medications Prior to Admission medications   Medication Sig Start Date End Date Taking? Authorizing Provider  naproxen sodium (ALEVE) 220 MG tablet Take 220 mg by mouth daily as needed (pain).   Yes [provider]  pantoprazole (PROTONIX) 20 MG tablet Take 1 tablet (20 mg total) by mouth 2 (two) times daily. 04/18/20  Yes Linwood Dibbles, MD  hydrochlorothiazide (HYDRODIURIL) 12.5 MG tablet Take 1 tablet (12.5 mg total) by mouth daily. 04/18/20   Linwood Dibbles, MD  meloxicam (MOBIC) 7.5 MG tablet Take 1 tablet (7.5 mg total) by mouth daily. Patient not taking: Reported on  04/18/2020 09/02/19   Vickki Hearing, MD  methocarbamol (ROBAXIN) 500 MG tablet Take 1 tablet (500 mg total) by mouth 3 (three) times daily. Patient not taking: Reported on 04/18/2020 09/02/19   Vickki Hearing, MD  nortriptyline (PAMELOR) 10 MG capsule Take 1 capsule (10 mg total) by mouth at bedtime. Patient not taking: Reported on 04/18/2020 09/02/19   Vickki Hearing, MD    Allergies    Patient has no known allergies.  Review of Systems   Review of Systems  All other systems reviewed and are negative.   Physical Exam Updated Vital Signs BP (!) 137/92   Pulse 73   Temp 97.8 F (36.6 C)   Resp (!) 27   Ht 1.753 m (5\' 9" )   Wt 77.1 kg   SpO2 100%   BMI 25.10 kg/m   Physical Exam Vitals and nursing note reviewed.  Constitutional:      General: She is not in acute distress.    Appearance: She is well-developed and well-nourished.  HENT:     Head: Normocephalic and atraumatic.     Right Ear: External ear normal.     Left Ear: External ear normal.  Eyes:     General: No scleral icterus.       Right eye: No discharge.        Left eye: No discharge.     Conjunctiva/sclera: Conjunctivae normal.  Neck:     Trachea: No tracheal deviation.  Cardiovascular:     Rate and Rhythm: Normal rate and regular rhythm.     Pulses: Intact distal pulses.  Pulmonary:     Effort: Pulmonary effort is normal. No respiratory distress.     Breath sounds: Normal breath sounds. No stridor. No wheezing or rales.  Abdominal:     General: Bowel sounds are normal. There is no distension.     Palpations: Abdomen is soft.     Tenderness: There is no abdominal tenderness. There is no guarding or rebound.  Musculoskeletal:        General: No tenderness or edema.     Cervical back: Neck supple.  Skin:    General: Skin is warm and dry.     Findings: No rash.  Neurological:     Mental Status: She is alert.     Cranial Nerves: No cranial nerve deficit (no facial droop,  extraocular movements  intact, no slurred speech).     Sensory: No sensory deficit.     Motor: No abnormal muscle tone or seizure activity.     Coordination: Coordination normal.     Deep Tendon Reflexes: Strength normal.  Psychiatric:        Mood and Affect: Mood and affect normal.     ED Results / Procedures / Treatments   Labs (all labs ordered are listed, but only abnormal results are displayed) Labs Reviewed  BASIC METABOLIC PANEL  CBC  BRAIN NATRIURETIC PEPTIDE  POC URINE PREG, ED  TROPONIN I (HIGH SENSITIVITY)    EKG EKG Interpretation  Date/Time:  Tuesday April 18 2020 08:44:08 EST Ventricular Rate:  83 PR Interval:    QRS Duration: 88 QT Interval:  366 QTC Calculation: 430 R Axis:   70 Text Interpretation: Sinus rhythm No significant change since last tracing Confirmed by Linwood Dibbles [phone]) on 04/18/2020 8:51:26 AM   Radiology DG Chest Portable 1 View  Result Date: 04/18/2020 CLINICAL DATA:  Chest pain. EXAM: PORTABLE CHEST 1 VIEW COMPARISON:  March 01, 2018. FINDINGS: The heart size and mediastinal contours are within normal limits. Both lungs are clear. No pneumothorax or pleural effusion is noted. The visualized skeletal structures are unremarkable. IMPRESSION: No active disease. Electronically Signed   By: Lupita Raider M.D.   On: 04/18/2020 09:03    Procedures Procedures   Medications Ordered in ED Medications  aspirin chewable tablet 324 mg (324 mg Oral Given 04/18/20 4098)    ED Course  I have reviewed the triage vital signs and the nursing notes.  Pertinent labs & imaging results that were available during my care of the patient were reviewed by me and considered in my medical decision making (see chart for details).  Clinical Course as of 04/18/20 1030  Tue Apr 18, 2020  1004 Initial troponin normal.  BNP normal.  Chest x-ray metabolic panel normal. [JK]    Clinical Course User Index [JK] Linwood Dibbles, MD   MDM Rules/Calculators/A&P HEAR Score: 3                         Patient's ED work-up is reassuring.  Symptoms concerning initially for the possibility of CHF but chest x-ray does not show evidence of pulmonary edema and the BNP is normal.  Symptoms not suggestive of pulmonary embolism.  Patient does not have a history of PE or DVT.  Low risk for PE.  PERC negative.  Doubt PE.  Patient has not been taking her blood pressure medications but her blood pressure is only mildly elevated.  Doubt hypertensive emergency.  Possible her symptoms may be related to acid reflux.  I will start her back on her blood pressure medication as well start on a course of antacids.  Recommend outpatient follow-up with primary care doctor. Final Clinical Impression(s) / ED Diagnoses Final diagnoses:  Hypertension, unspecified type    Rx / DC Orders ED Discharge Orders         Ordered    hydrochlorothiazide (HYDRODIURIL) 12.5 MG tablet  Daily        04/18/20 1022    pantoprazole (PROTONIX) 20 MG tablet  2 times daily        04/18/20 1022           Linwood Dibbles, MD 04/18/20 1030

## 2020-04-18 NOTE — Discharge Instructions (Signed)
Take the medications as prescribed.  Follow-up with a primary care doctor to make sure the symptoms resolve or to discuss further testing if the symptoms persist

## 2020-04-18 NOTE — ED Triage Notes (Signed)
Pt c/o of central cp and sob x 2 months. States getting worse while lying down. " I feel like im being choked"

## 2020-09-19 IMAGING — CT CT L SPINE W/ CM
1 of 8 series · 5 of 14 positions shown, 7 images · non-contrast
Comparison: MR lumbar spine 11/12/18

CLINICAL DATA: Low back pain extending into the lower extremity
within an S1 radicular pattern.
TECHNIQUE: Contiguous axial images were obtained through the Lumbar spine after
the intrathecal infusion of infusion. Coronal and sagittal
reconstructions were obtained of the axial image sets.

[Series 3: l spine soft · axial · 0.32mm/px · z∈[-285,-105]mm · 5 of 90 slices shown, 7 images]
[im 15/90  soft-tissue]
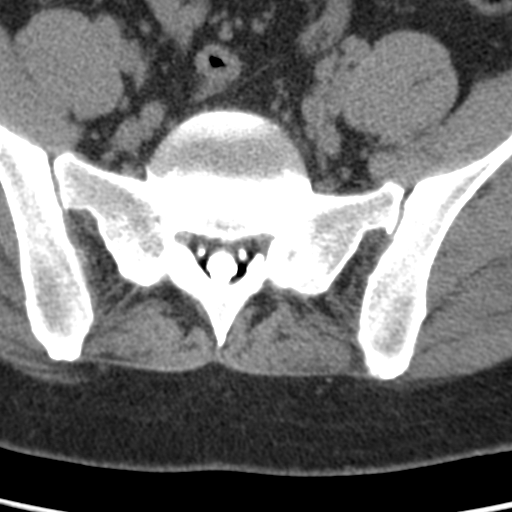
[im 15/90  bone]
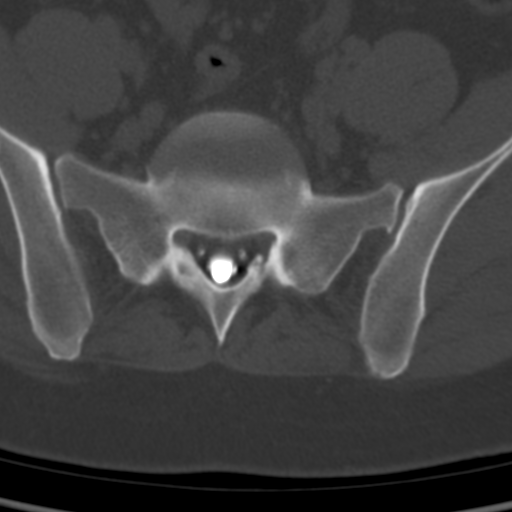
[im 30/90  bone]
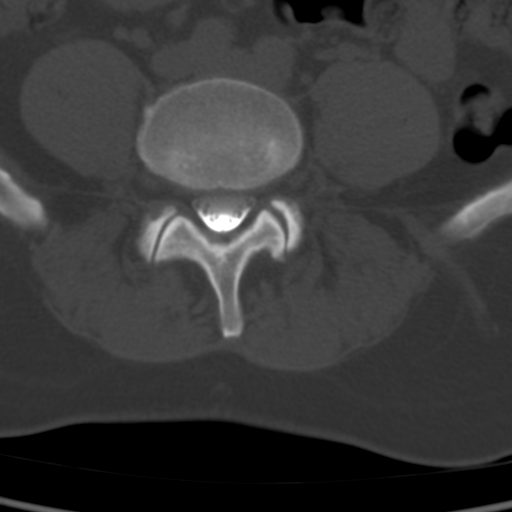
[im 45/90  bone]
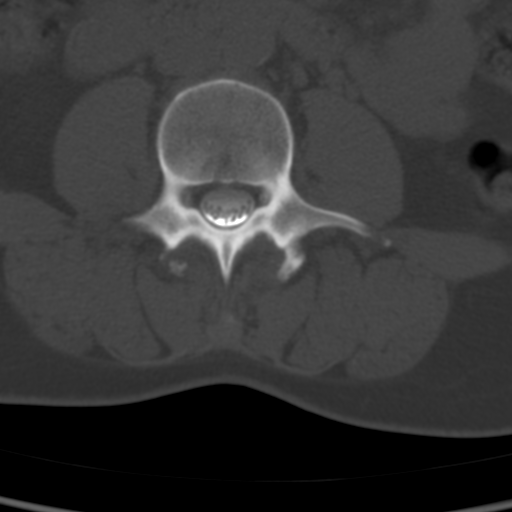
[im 60/90  bone]
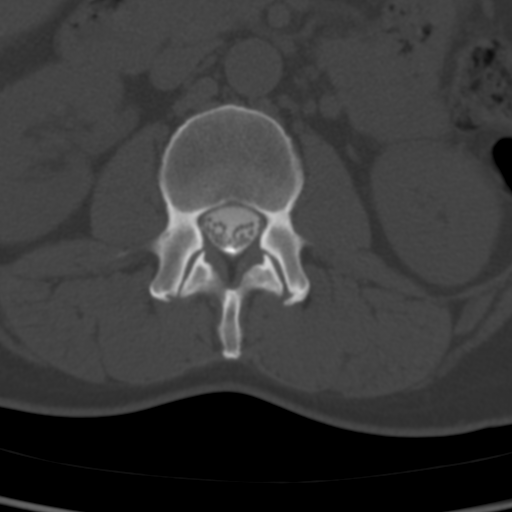
[im 75/90  soft-tissue]
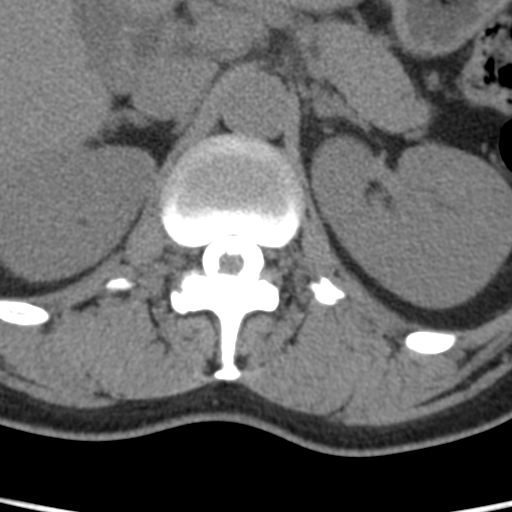
[im 75/90  bone]
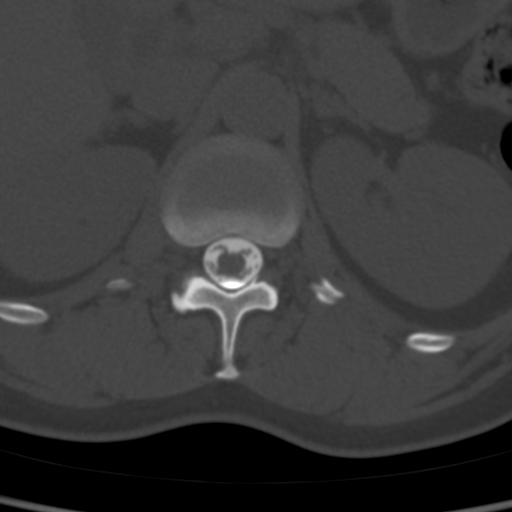

[5 of 14 positions shown; findings below may reference images not displayed]

EXAM:
LUMBAR MYELOGRAM

FLUOROSCOPY TIME:  Radiation Exposure Index (as provided by the
fluoroscopic device): 311.96 uGy*m2

PROCEDURE:
After thorough discussion of risks and benefits of the procedure
including bleeding, infection, injury to nerves, blood vessels,
adjacent structures as well as headache and CSF leak, written and
oral informed consent was obtained. Consent was obtained by Dr.
Laberion Mezinec. Time out form was completed.

Patient was positioned prone on the fluoroscopy table. Local
anesthesia was provided with 1% lidocaine without epinephrine after
prepped and draped in the usual sterile fashion. Puncture was
performed at L2-3 using a 3 1/2 inch 22-gauge spinal needle via
right paramedian approach. Using a single pass through the dura, the
needle was placed within the thecal sac, with return of clear CSF.
15 mL of Isovue C-ANN was injected into the thecal sac, with normal
opacification of the nerve roots and cauda equina consistent with
free flow within the subarachnoid space.

I personally performed the lumbar puncture and administered the
intrathecal contrast. I also personally supervised acquisition of
the myelogram images.
FINDINGS: LUMBAR MYELOGRAM FINDINGS:

Lumbar nerve roots fill normally on both sides. The spinal canal.
Slight disc bulging is present L4-5 L5-S1 extension. No listhesis is
present.

CT LUMBAR MYELOGRAM FINDINGS:

Lumbar spine is imaged from the midbody of T11 through the midbody
of S3. The vertebral body heights are normal. No significant
listhesis is present.

Conus medullaris terminates at L1-2, within. Soft tissues of the
abdomen unremarkable.

L1-2: Negative.

L2-3: Moderate facet hypertrophy is present bilaterally. There is
some thickening of the ligamentum flavum. No significant stenosis is
present.

L3-4: Mild disc bulging is present. No significant stenosis is
present.

L4-5: Facet hypertrophy is present the left. Is some disc bulging.
No significant stenosis present.

L5-S1: Mild disc bulging is present to the left. Asymmetric
left-sided facet hypertrophy and focal spurring contributes mild
left subarticular stenosis.
IMPRESSION: 1. Mild left subarticular stenosis at L5-S1 secondary to asymmetric
left-sided facet hypertrophy and focal spurring. This is the most
likely etiology for a left S1 radiculopathy.
2. Mild disc bulging at L4-5 and L5-S1 without significant stenosis.
3. Moderate facet hypertrophy at L2-3 without significant stenosis.

## 2020-09-21 ENCOUNTER — Emergency Department (HOSPITAL_COMMUNITY)
Admission: EM | Admit: 2020-09-21 | Discharge: 2020-09-21 | Disposition: A | Discharge status: Home / Self Care | Payer: Self-pay | Attending: Emergency Medicine | Admitting: Emergency Medicine

## 2020-09-21 ENCOUNTER — Other Ambulatory Visit: Payer: Self-pay

## 2020-09-21 ENCOUNTER — Encounter (HOSPITAL_COMMUNITY): Payer: Self-pay | Admitting: *Deleted

## 2020-09-21 ENCOUNTER — Emergency Department (HOSPITAL_COMMUNITY): Payer: Self-pay

## 2020-09-21 DIAGNOSIS — Z79899 Other long term (current) drug therapy: Secondary | ICD-10-CM | POA: Insufficient documentation

## 2020-09-21 DIAGNOSIS — I10 Essential (primary) hypertension: Secondary | ICD-10-CM | POA: Insufficient documentation

## 2020-09-21 DIAGNOSIS — X58XXXA Exposure to other specified factors, initial encounter: Secondary | ICD-10-CM | POA: Insufficient documentation

## 2020-09-21 DIAGNOSIS — S39012A Strain of muscle, fascia and tendon of lower back, initial encounter: Secondary | ICD-10-CM | POA: Insufficient documentation

## 2020-09-21 DIAGNOSIS — M25461 Effusion, right knee: Secondary | ICD-10-CM | POA: Insufficient documentation

## 2020-09-21 DIAGNOSIS — F17210 Nicotine dependence, cigarettes, uncomplicated: Secondary | ICD-10-CM | POA: Insufficient documentation

## 2020-09-21 MED ORDER — NAPROXEN 500 MG PO TABS: 500.0000 mg | ORAL_TABLET | Freq: Two times a day (BID) | ORAL | 0 refills | Status: AC

## 2020-09-21 MED ORDER — AMLODIPINE BESYLATE 5 MG PO TABS: 5.0000 mg | ORAL_TABLET | Freq: Every day | ORAL | 0 refills | Status: DC

## 2020-09-21 NOTE — ED Triage Notes (Signed)
Right knee pain , states she has fluid on knee and also has back pain

## 2020-09-21 NOTE — Discharge Instructions (Addendum)
Continue using the brace to minimize bending and to give your knee some support and compression.  Try to avoid bending your knee more than 90 degrees as discussed, this will allow the effusion to hopefully resolve spontaneously.  You have been prescribed medication to help you with pain and inflammation.  You have also been prescribed a new medication for your blood pressure since it is significantly elevated today.  Amlodipine is not an expensive medicine and hopefully will do a better job at blood pressure control.  Application of ice and heat can be helpful for your knee pain as well.  I wanted to have your blood pressure rechecked next week, call the [facility] to arrange this follow-up care.  [facility]  [address] [place], [place] [address] [phone]  Services [facility] offers a variety of basic health services.  Services include but are not limited to: Blood pressure checks  Heart rate checks  Blood sugar checks  Urine analysis  Rapid strep tests  Pregnancy tests.  Health education and referrals  People needing more complex services will be directed to a physician online. Using these virtual visits, doctors can evaluate and prescribe medicine and treatments. There will be no medication on-site, though [facility] will help patients fill their prescriptions at little to no cost.   For More information please go to: [email or web address]

## 2020-09-23 NOTE — ED Provider Notes (Signed)
[facility] EMERGENCY DEPARTMENT Provider Note   CSN: [id number] Arrival date & time: 09/21/20  1249     History No chief complaint on file.   Elizabeth Quinn is a 50 y.o. female presenting with complaints of acute on chronic low back pain in association with right knee pain and swelling which started about a week ago. She denies any new injuries or falls.  She reports prior history of left knee arthroscopy by Dr. Romeo Apple last fall for arthritis. This knee is better but her right knee now reminds her of problems she had with the left prior to her surgery.   Her back pain does not radiate to her legs.  There has been no weakness or numbness in the lower extremities and no urinary or bowel retention or incontinence.  Patient does not have a history of cancer or IVDU.  The patient has tried ice and rest without significant relief of symptoms.  Of note, bp significantly elevated. No current pcp, was taking hctz which never controlled her bp so stopped taking.  Denies headache, cp, vision changes, peripheral edema, sob.    The history is provided by the patient.      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     Past Surgical History:  Procedure Laterality Date   ABLATION     DILATION AND CURETTAGE OF UTERUS     INDUCED ABORTION     KNEE ARTHROSCOPY Left 12/10/2018   Procedure: DIAGNOSTIC KNEE ARTHROSCOPY;  Surgeon: Vickki Hearing, MD;  Location: [facility];  Service: Orthopedics;  Laterality: Left;   MENISCUS REPAIR     TUBAL LIGATION  17 yrs ago     OB History     Gravida  5   Para  0   Term  0   Preterm  0   AB  0   Living         SAB  0   IAB  0   Ectopic  0   Multiple      Live Births              Family History   Problem Relation Age of Onset   Cancer Paternal Grandfather    Stroke Maternal Grandfather    Diabetes Mother    Hypertension Mother    Anesthesia problems Neg Hx    Hypotension Neg Hx    Malignant hyperthermia Neg Hx    Pseudochol deficiency Neg Hx     Social History   Tobacco Use   Smoking status: Every Day    Packs/day: 1.00    Years: 21.00    Pack years: 21.00    Types: Cigarettes   Smokeless tobacco: Never  Vaping Use   Vaping Use: Never used  Substance Use Topics   Alcohol use: Yes    Comment: weekends-drinks 12 pack   Drug use: No    Home Medications Prior to Admission medications   Medication Sig Start Date End Date Taking? Authorizing Provider  amLODipine (NORVASC) 5 MG tablet Take 1 tablet (5 mg total) by mouth daily. 09/21/20  Yes Lizeth Bencosme, Raynelle Fanning, PA-C  naproxen (NAPROSYN) 500 MG tablet Take 1 tablet (500 mg total) by mouth  2 (two) times daily. 09/21/20  Yes Faatimah Spielberg, Raynelle Fanning, PA-C  hydrochlorothiazide (HYDRODIURIL) 12.5 MG tablet Take 1 tablet (12.5 mg total) by mouth daily. 04/18/20   Linwood Dibbles, MD  meloxicam (MOBIC) 7.5 MG tablet Take 1 tablet (7.5 mg total) by mouth daily. Patient not taking: Reported on 04/18/2020 09/02/19   Vickki Hearing, MD  methocarbamol (ROBAXIN) 500 MG tablet Take 1 tablet (500 mg total) by mouth 3 (three) times daily. Patient not taking: Reported on 04/18/2020 09/02/19   Vickki Hearing, MD  nortriptyline (PAMELOR) 10 MG capsule Take 1 capsule (10 mg total) by mouth at bedtime. Patient not taking: Reported on 04/18/2020 09/02/19   Vickki Hearing, MD  pantoprazole (PROTONIX) 20 MG tablet Take 1 tablet (20 mg total) by mouth 2 (two) times daily. 04/18/20   Linwood Dibbles, MD    Allergies    Patient has no known allergies.  Review of Systems   Review of Systems  Constitutional:  Negative for fever.  Respiratory:  Negative for shortness of breath.   Cardiovascular:  Negative for chest pain and leg swelling.  Gastrointestinal:  Negative for  abdominal distention, abdominal pain and constipation.  Genitourinary:  Negative for difficulty urinating, dysuria, flank pain, frequency and urgency.  Musculoskeletal:  Positive for arthralgias, back pain and joint swelling. Negative for gait problem and myalgias.  Skin:  Negative for rash.  Neurological:  Negative for weakness and numbness.   Physical Exam Updated Vital Signs BP (!) 172/95 (BP Location: Left Arm)   Pulse 65   Temp 98.2 F (36.8 C)   Resp 16   Ht 5\' 9"  (1.753 m)   Wt 79.4 kg   SpO2 100%   BMI 25.84 kg/m   Physical Exam Vitals and nursing note reviewed.  Constitutional:      Appearance: She is well-developed.  HENT:     Head: Normocephalic.  Eyes:     Conjunctiva/sclera: Conjunctivae normal.  Cardiovascular:     Rate and Rhythm: Normal rate.     Pulses: Normal pulses.     Comments: Pedal pulses normal. Pulmonary:     Effort: Pulmonary effort is normal.  Abdominal:     General: Bowel sounds are normal. There is no distension.     Palpations: Abdomen is soft. There is no mass.  Musculoskeletal:        General: Normal range of motion.     Cervical back: Normal range of motion and neck supple.     Lumbar back: Tenderness present. No swelling, edema or spasms.     Right knee: Effusion and crepitus present. Tenderness present over the medial joint line and lateral joint line. No LCL laxity, MCL laxity, ACL laxity or PCL laxity. Normal patellar mobility.     Comments: Crepitus with ROM. No erythema.   Skin:    General: Skin is warm and dry.  Neurological:     Mental Status: She is alert.     Sensory: No sensory deficit.     Motor: No tremor or atrophy.     Gait: Gait normal.     Deep Tendon Reflexes:     Reflex Scores:      Achilles reflexes are 2+ on the right side and 2+ on the left side.    Comments: No strength deficit noted in hip and knee flexor and extensor muscle groups.  Ankle flexion and extension intact. No clonus, no foot drop.  Knee dtr's not  assessed due to right knee pain and effusion.  ED Results / Procedures / Treatments   Labs (all labs ordered are listed, but only abnormal results are displayed) Labs Reviewed - No data to display  EKG None  Radiology DG Knee Complete 4 Views Right  Result Date: 09/21/2020 CLINICAL DATA:  Pain EXAM: RIGHT KNEE - COMPLETE 4+ VIEW COMPARISON:  None. FINDINGS: There is no acute fracture or dislocation. There is mild medial and lateral compartment degenerative change with joint space narrowing. There is a moderate-sized joint effusion. IMPRESSION: No acute fracture.  Moderate-sized joint effusion. Mild medial and lateral compartment degenerative change. Electronically Signed   By: Caprice Renshaw   On: 09/21/2020 15:06    Procedures Procedures   Medications Ordered in ED Medications - No data to display  ED Course  I have reviewed the triage vital signs and the nursing notes.  Pertinent labs & imaging results that were available during my care of the patient were reviewed by me and considered in my medical decision making (see chart for details).    MDM Rules/Calculators/A&P                           Imaging reviewed and discussed with pt.  No neuro deficits or concern for cauda equina.  Suspect low back pain possibly secondary to gait change from right knee effusion and pain.  Discussed RICE. Pt presents with velcro knee brace encouraged continued use.  Referral to Dr. Romeo Apple.  Also started on amlodipine for bp issue. Asymptomatic. Referred to [facility] Bower for recheck bp next week.  Final Clinical Impression(s) / ED Diagnoses Final diagnoses:  Knee effusion, right  Primary hypertension  Strain of lumbar region, initial encounter    Rx / DC Orders ED Discharge Orders          Ordered    amLODipine (NORVASC) 5 MG tablet  Daily        09/21/20 1635    naproxen (NAPROSYN) 500 MG tablet  2 times daily        09/21/20 1635             Burgess Amor, Cordelia Poche 09/23/20 1339     Long, Arlyss Repress, MD 09/25/20 1254

## 2020-10-16 ENCOUNTER — Ambulatory Visit: Payer: Self-pay | Admitting: Orthopedic Surgery

## 2020-10-19 ENCOUNTER — Ambulatory Visit: Payer: Self-pay | Admitting: Orthopedic Surgery

## 2021-04-16 ENCOUNTER — Other Ambulatory Visit (HOSPITAL_COMMUNITY): Payer: Self-pay | Admitting: Family Medicine

## 2021-04-16 DIAGNOSIS — Z1231 Encounter for screening mammogram for malignant neoplasm of breast: Secondary | ICD-10-CM

## 2021-10-30 ENCOUNTER — Encounter (HOSPITAL_COMMUNITY): Payer: Self-pay

## 2021-10-30 ENCOUNTER — Emergency Department (HOSPITAL_COMMUNITY)
Admission: EM | Admit: 2021-10-30 | Discharge: 2021-10-30 | Disposition: A | Discharge status: Home / Self Care | Payer: 59 | Attending: Emergency Medicine | Admitting: Emergency Medicine

## 2021-10-30 ENCOUNTER — Other Ambulatory Visit: Payer: Self-pay

## 2021-10-30 ENCOUNTER — Emergency Department (HOSPITAL_COMMUNITY): Payer: 59

## 2021-10-30 DIAGNOSIS — R1013 Epigastric pain: Secondary | ICD-10-CM | POA: Insufficient documentation

## 2021-10-30 DIAGNOSIS — R072 Precordial pain: Secondary | ICD-10-CM | POA: Diagnosis not present

## 2021-10-30 DIAGNOSIS — R079 Chest pain, unspecified: Secondary | ICD-10-CM

## 2021-10-30 DIAGNOSIS — R42 Dizziness and giddiness: Secondary | ICD-10-CM | POA: Insufficient documentation

## 2021-10-30 DIAGNOSIS — R0789 Other chest pain: Secondary | ICD-10-CM | POA: Diagnosis not present

## 2021-10-30 DIAGNOSIS — I160 Hypertensive urgency: Secondary | ICD-10-CM | POA: Diagnosis not present

## 2021-10-30 LAB — CBC
HCT: 44.1 % (ref 36.0–46.0)
Hemoglobin: 15 g/dL (ref 12.0–15.0)
MCH: 28.5 pg (ref 26.0–34.0)
MCHC: 34 g/dL (ref 30.0–36.0)
MCV: 83.8 fL (ref 80.0–100.0)
Platelets: 370 10*3/uL (ref 150–400)
RBC: 5.26 MIL/uL — ABNORMAL HIGH (ref 3.87–5.11)
RDW: 13.8 % (ref 11.5–15.5)
WBC: 5.3 10*3/uL (ref 4.0–10.5)
nRBC: 0 % (ref 0.0–0.2)

## 2021-10-30 LAB — BASIC METABOLIC PANEL
Anion gap: 8 (ref 5–15)
BUN: 9 mg/dL (ref 6–20)
CO2: 27 mmol/L (ref 22–32)
Calcium: 9.6 mg/dL (ref 8.9–10.3)
Chloride: 106 mmol/L (ref 98–111)
Creatinine, Ser: 0.95 mg/dL (ref 0.44–1.00)
GFR, Estimated: >60 mL/min (ref >60)
Glucose, Bld: 86 mg/dL (ref 70–99)
Potassium: 4.1 mmol/L (ref 3.5–5.1)
Sodium: 141 mmol/L (ref 135–145)

## 2021-10-30 LAB — TROPONIN I (HIGH SENSITIVITY)
Troponin I (High Sensitivity): 3 ng/L (ref <18)
Troponin I (High Sensitivity): 3 ng/L (ref <18)

## 2021-10-30 MED ORDER — HYDROCHLOROTHIAZIDE 12.5 MG PO TABS: 12.5000 mg | ORAL_TABLET | Freq: Every day | ORAL | 1 refills | Status: AC

## 2021-10-30 MED ORDER — AMLODIPINE BESYLATE 5 MG PO TABS: 5.0000 mg | ORAL_TABLET | Freq: Every day | ORAL | 0 refills | Status: AC

## 2021-10-30 NOTE — ED Triage Notes (Signed)
Pt to er, pt states that she is here for some L sided chest pain, states that the pain started about 30 minutes to an hour ago.  States that it is a constant pain.  Pt states that it is more of a pressure than a pain.  Pt denies sob.  Pt states that she also has some dizziness

## 2021-10-30 NOTE — ED Provider Notes (Signed)
[facility] EMERGENCY DEPARTMENT Provider Note   CSN: [id number] Arrival date & time: 10/30/21  1516     History Chief Complaint  Patient presents with   Chest Pain    HPI Elizabeth Quinn is a 51 y.o. female presenting for chest pain.  Patient is a 51 year old female who states that approximately 3 hours prior to arrival she started having substernal chest pain.  She states it radiates to her epigastrium. She is ambulatory tolerating p.o. intake and states her symptoms have grossly improved.  It was accompanied by an episode of lightheadedness.  She has a history of similar in the past related to her hypertensive urgency.  She was started on blood pressure medications and has had improvement of the symptoms in the interim.  However today she states that her blood pressure was high at home which she believes is relating to her symptoms.  Notably, she states she ran out of all of her blood pressure medicine a few weeks ago..   Patient's recorded medical, surgical, social, medication list and allergies were reviewed in the Snapshot window as part of the initial history.   Review of Systems   Review of Systems  Constitutional:  Negative for chills and fever.  HENT:  Negative for ear pain and sore throat.   Eyes:  Negative for pain and visual disturbance.  Respiratory:  Negative for cough and shortness of breath.   Cardiovascular:  Positive for chest pain. Negative for palpitations.  Gastrointestinal:  Negative for abdominal pain and vomiting.  Genitourinary:  Negative for dysuria and hematuria.  Musculoskeletal:  Negative for arthralgias and back pain.  Skin:  Negative for color change and rash.  Neurological:  Positive for light-headedness. Negative for seizures and syncope.  All other systems reviewed and are negative.   Physical Exam Updated Vital Signs BP 129/89   Pulse 74   Temp 98.5 F (36.9 C) (Oral)   Resp (!) 25   Ht 5\' 9"  (1.753 m)   Wt 65.8 kg   SpO2 100%   BMI  21.41 kg/m  Physical Exam Vitals and nursing note reviewed.  Constitutional:      General: She is not in acute distress.    Appearance: She is well-developed.  HENT:     Head: Normocephalic and atraumatic.  Eyes:     Conjunctiva/sclera: Conjunctivae normal.  Cardiovascular:     Rate and Rhythm: Normal rate and regular rhythm.     Heart sounds: No murmur heard. Pulmonary:     Effort: Pulmonary effort is normal. No respiratory distress.     Breath sounds: Normal breath sounds.  Abdominal:     General: There is no distension.     Palpations: Abdomen is soft.     Tenderness: There is no abdominal tenderness. There is no right CVA tenderness or left CVA tenderness.  Musculoskeletal:        General: No swelling or tenderness. Normal range of motion.     Cervical back: Neck supple.  Skin:    General: Skin is warm and dry.  Neurological:     General: No focal deficit present.     Mental Status: She is alert and oriented to person, place, and time. Mental status is at baseline.     Cranial Nerves: No cranial nerve deficit.      ED Course/ Medical Decision Making/ A&P Clinical Course as of 10/30/21 1944  Tue Oct 30, 2021  1820 Chest pain, Delta trop [CC]    Clinical Course  User Index [CC] Glyn Ade, MD    Procedures Procedures   Medications Ordered in ED Medications - No data to display Medical Decision Making:  Elizabeth Quinn is a 51 y.o. female who presented to the ED today with chest pain, detailed above.  Based on patient's comorbidities, patient has a heart score of 3.     Patient's presentation is complicated by their history of multiple comorbid medical conditions including hypertension, hyperlipidemia, smoking, medication without adherence in the outpatient setting.  Patient placed on continuous vitals and telemetry monitoring while in ED which was reviewed periodically.   Complete initial physical exam performed, notably the patient was hemodynamically  stable in no acute distress.  She is currently asymptomatic.    Reviewed and confirmed nursing documentation for past medical history, family history, social history.       Initial Assessment:  With the patient's presentation of left-sided chest pain, most likely diagnosis is musculoskeletal chest pain versus GERD, although ACS remains on the differential. Other diagnoses were considered including (but not limited to) pulmonary embolism, community-acquired pneumonia, aortic dissection, pneumothorax, underlying bony abnormality, anemia. These are considered less likely due to history of present illness and physical exam findings.     In particular, concerning pulmonary embolism: Patient is PERC negative and the they deny malignancy, recent surgery, history of DVT, or calf tenderness leading to a low risk Wells score.  Aortic Dissection also reconsidered but seems less likely based on the location, quality, onset, and severity of symptoms in this case. Patient also has a lack of underlying history of AD or TAA. Additionally, physical exam is without a difference of more than in blood pressure between the arms.   This is most consistent with an acute life/limb threatening illness complicated by underlying chronic conditions.   Initial Plan:  Evaluate for ACS with delta troponin and EKG evaluated as below  Evaluate for dissection, bony abnormality, or pneumonia with chest x-ray and screening laboratory evaluation including CBC, BMP  Further evaluation for pulmonary embolism not indicated at this time based on patient's PERC and Wells score.  Further evaluation for Thoracic Aortic Dissection not indicated at this time based on patient's clinical history and PE findings.          Initial Study Results: EKG was reviewed independently. Rate, rhythm, axis, intervals all examined and without medically relevant abnormality. ST segments without concerns for elevations.     Laboratory    Delta troponin demonstrated no acute abnormality  CBC and BMP without obvious metabolic or inflammatory abnormalities requiring further evaluation      Radiology  DG Chest 2 View  Result Date: 10/30/2021 CLINICAL DATA:  Left-sided chest pain EXAM: CHEST - 2 VIEW COMPARISON:  Chest radiograph 04/18/2020 FINDINGS: The cardiomediastinal silhouette is normal There is no focal consolidation or pulmonary edema. There is no pleural effusion or pneumothorax There is no acute osseous abnormality. IMPRESSION: No radiographic evidence of acute cardiopulmonary process. Electronically Signed   By: Lesia Hausen M.D.   On: 10/30/2021 16:12         Final Assessment and Plan:   On reassessment, patient is completely asymptomatic.  She is sitting up in bed asking if she can have dinner.  Ultimately, patient has a heart score of 3 which warrants close outpatient reassessment by her primary care provider with discussion of cardiology referral at that time.  She states she has run out of her blood pressure medicines and feels that that was likely related to  her syndrome as earlier today she had high blood pressure episodes. Currently she is asymptomatic well-appearing stable for outpatient care and management.  Shared medical decision making regarding her ongoing ED observation on telemetry though patient felt comfortable with discharge at this time.  Patient discharged with no further acute events.   Clinical Impression:  1. Nonspecific chest pain      Data Unavailable   Final Clinical Impression(s) / ED Diagnoses Final diagnoses:  Nonspecific chest pain    Rx / DC Orders ED Discharge Orders          Ordered    amLODipine (NORVASC) 5 MG tablet  Daily        10/30/21 1944    hydrochlorothiazide (HYDRODIURIL) 12.5 MG tablet  Daily        10/30/21 1944              Glyn Ade, MD 10/30/21 1944

## 2022-04-25 ENCOUNTER — Encounter: Payer: Self-pay | Admitting: Radiology

## 2022-05-16 ENCOUNTER — Telehealth: Payer: Self-pay

## 2022-05-16 NOTE — Telephone Encounter (Signed)
Mychart msg sent

## 2022-12-05 DIAGNOSIS — J168 Pneumonia due to other specified infectious organisms: Secondary | ICD-10-CM | POA: Diagnosis not present

## 2022-12-05 DIAGNOSIS — F17200 Nicotine dependence, unspecified, uncomplicated: Secondary | ICD-10-CM | POA: Diagnosis not present

## 2022-12-07 ENCOUNTER — Other Ambulatory Visit: Payer: Self-pay

## 2022-12-07 ENCOUNTER — Emergency Department (HOSPITAL_COMMUNITY)
Admission: EM | Admit: 2022-12-07 | Discharge: 2022-12-07 | Disposition: A | Discharge status: Home / Self Care | Payer: Medicaid Other | Attending: Student | Admitting: Student

## 2022-12-07 ENCOUNTER — Emergency Department (HOSPITAL_COMMUNITY): Payer: Medicaid Other

## 2022-12-07 ENCOUNTER — Encounter (HOSPITAL_COMMUNITY): Payer: Self-pay

## 2022-12-07 DIAGNOSIS — F17210 Nicotine dependence, cigarettes, uncomplicated: Secondary | ICD-10-CM | POA: Diagnosis not present

## 2022-12-07 DIAGNOSIS — Z79899 Other long term (current) drug therapy: Secondary | ICD-10-CM | POA: Insufficient documentation

## 2022-12-07 DIAGNOSIS — J189 Pneumonia, unspecified organism: Secondary | ICD-10-CM | POA: Diagnosis not present

## 2022-12-07 DIAGNOSIS — J984 Other disorders of lung: Secondary | ICD-10-CM | POA: Diagnosis not present

## 2022-12-07 DIAGNOSIS — R0602 Shortness of breath: Secondary | ICD-10-CM | POA: Diagnosis not present

## 2022-12-07 DIAGNOSIS — I10 Essential (primary) hypertension: Secondary | ICD-10-CM | POA: Insufficient documentation

## 2022-12-07 DIAGNOSIS — R918 Other nonspecific abnormal finding of lung field: Secondary | ICD-10-CM | POA: Diagnosis not present

## 2022-12-07 LAB — CBC WITH DIFFERENTIAL/PLATELET
Abs Immature Granulocytes: 0 10*3/uL (ref 0.00–0.07)
Basophils Absolute: 0.1 10*3/uL (ref 0.0–0.1)
Basophils Relative: 1 %
Eosinophils Absolute: 0.3 10*3/uL (ref 0.0–0.5)
Eosinophils Relative: 4 %
HCT: 40.6 % (ref 36.0–46.0)
Hemoglobin: 13.4 g/dL (ref 12.0–15.0)
Lymphocytes Relative: 24 %
Lymphs Abs: 2 10*3/uL (ref 0.7–4.0)
MCH: 27.6 pg (ref 26.0–34.0)
MCHC: 33 g/dL (ref 30.0–36.0)
MCV: 83.7 fL (ref 80.0–100.0)
Monocytes Absolute: 0.3 10*3/uL (ref 0.1–1.0)
Monocytes Relative: 3 %
Neutro Abs: 5.8 10*3/uL (ref 1.7–7.7)
Neutrophils Relative %: 68 %
Platelets: 547 10*3/uL — ABNORMAL HIGH (ref 150–400)
RBC: 4.85 MIL/uL (ref 3.87–5.11)
RDW: 13.5 % (ref 11.5–15.5)
WBC: 8.5 10*3/uL (ref 4.0–10.5)
nRBC: 0 % (ref 0.0–0.2)

## 2022-12-07 LAB — URINALYSIS, ROUTINE W REFLEX MICROSCOPIC
Bilirubin Urine: NEGATIVE
Glucose, UA: NEGATIVE mg/dL
Hgb urine dipstick: NEGATIVE
Ketones, ur: 5 mg/dL — AB
Leukocytes,Ua: NEGATIVE
Nitrite: NEGATIVE
Protein, ur: NEGATIVE mg/dL
Specific Gravity, Urine: 1.026 (ref 1.005–1.030)
pH: 5 (ref 5.0–8.0)

## 2022-12-07 LAB — COMPREHENSIVE METABOLIC PANEL
ALT: 15 U/L (ref 0–44)
AST: 17 U/L (ref 15–41)
Albumin: 3.5 g/dL (ref 3.5–5.0)
Alkaline Phosphatase: 71 U/L (ref 38–126)
Anion gap: 7 (ref 5–15)
BUN: 8 mg/dL (ref 6–20)
CO2: 28 mmol/L (ref 22–32)
Calcium: 9.6 mg/dL (ref 8.9–10.3)
Chloride: 104 mmol/L (ref 98–111)
Creatinine, Ser: 0.91 mg/dL (ref 0.44–1.00)
GFR, Estimated: >60 mL/min (ref >60)
Glucose, Bld: 90 mg/dL (ref 70–99)
Potassium: 4.6 mmol/L (ref 3.5–5.1)
Sodium: 139 mmol/L (ref 135–145)
Total Bilirubin: 0.6 mg/dL (ref 0.3–1.2)
Total Protein: 7.7 g/dL (ref 6.5–8.1)

## 2022-12-07 LAB — LIPASE, BLOOD: Lipase: 25 U/L (ref 11–51)

## 2022-12-07 MED ORDER — IPRATROPIUM-ALBUTEROL 0.5-2.5 (3) MG/3ML IN SOLN
3.0000 mL | Freq: Once | RESPIRATORY_TRACT | Status: AC
  Administered 2022-12-07: 3 mL via RESPIRATORY_TRACT
  Filled 2022-12-07: qty 3

## 2022-12-07 MED ORDER — ALBUTEROL SULFATE HFA 108 (90 BASE) MCG/ACT IN AERS
2.0000 | INHALATION_SPRAY | RESPIRATORY_TRACT | Status: DC | PRN
  Administered 2022-12-07: 2 via RESPIRATORY_TRACT
  Filled 2022-12-07: qty 6.7

## 2022-12-07 MED ORDER — ALBUTEROL SULFATE HFA 108 (90 BASE) MCG/ACT IN AERS: 2.0000 | INHALATION_SPRAY | RESPIRATORY_TRACT | Status: DC | PRN

## 2022-12-07 MED ORDER — AEROCHAMBER PLUS FLO-VU MEDIUM MISC
1.0000 | Freq: Once | Status: AC
  Administered 2022-12-07: 1
  Filled 2022-12-07 (×2): qty 1

## 2022-12-07 NOTE — ED Triage Notes (Signed)
[facility]10 Told she has pneumonia  Given amox, doxy and zofran  Also given inhaler, hasn't used.   Feels like breathing is worse today  99% RA in triage

## 2022-12-07 NOTE — Discharge Instructions (Signed)
Complete the entire course of the antibiotics you were prescribed at your prior visit.  Start using the inhaler given taking 2 puffs every 4 hours if needed for cough, wheezing or shortness of breath.  I do encourage you to take the Zofran medication which should help you with the nausea symptom.  Plan to follow-up at the clinic that was previously arranged, you do need a repeat chest x-ray once your antibiotics have been completed to ensure resolution of this infection.

## 2022-12-07 NOTE — ED Provider Notes (Signed)
[place] [facility]MENT AT [facility] Provider Note   CSN: [id number] Arrival date & time: 12/07/22  1354     History  Chief Complaint  Patient presents with   Shortness of Breath    Elizabeth Quinn is a 52 y.o. female,Acid reflux, hypertension, prior history of bronchitis who is a cigarette smoker, was seen at [facility] in [place] 2 days ago and diagnosed with a right lower lobe pneumonia.  She was placed on Augmentin and doxycycline which she started yesterday, states compliance but has persistent productive cough and shortness of breath.  Additionally she has had nausea and vomiting.  She states she has been unable to keep any p.o. intake down since yesterday.  [facility] and was prescribed Zofran but has not taken this medication.  Additionally she was given albuterol inhaler, unfortunately left it in [place] when she came home to [place], was visiting her daughter in [place] until yesterday.  Denies fevers or chills.  She does endorse shortness of breath, not with exertion but with cough spells which "take her breath".  Cough has been occasionally productive of a yellow sputum.  The history is provided by the patient.       Home Medications Prior to Admission medications   Medication Sig Start Date End Date Taking? Authorizing Provider  amLODipine (NORVASC) 5 MG tablet Take 1 tablet (5 mg total) by mouth daily. 10/30/21   Glyn Ade, MD  hydrochlorothiazide (HYDRODIURIL) 12.5 MG tablet Take 1 tablet (12.5 mg total) by mouth daily. 10/30/21   Glyn Ade, MD  hydrochlorothiazide (HYDRODIURIL) 25 MG tablet Take 1 tablet by mouth daily. Patient not taking: Reported on 10/30/2021    [provider]  meloxicam (MOBIC) 7.5 MG tablet Take 1 tablet (7.5 mg total) by mouth daily. Patient not taking: Reported on 04/18/2020 09/02/19   Vickki Hearing, MD  methocarbamol (ROBAXIN) 500 MG tablet Take 1 tablet (500 mg  total) by mouth 3 (three) times daily. Patient not taking: Reported on 04/18/2020 09/02/19   Vickki Hearing, MD  naproxen (NAPROSYN) 500 MG tablet Take 1 tablet (500 mg total) by mouth 2 (two) times daily. Patient not taking: Reported on 10/30/2021 09/21/20   Burgess Amor, PA-C  nortriptyline (PAMELOR) 10 MG capsule Take 1 capsule (10 mg total) by mouth at bedtime. Patient not taking: Reported on 04/18/2020 09/02/19   Vickki Hearing, MD  pantoprazole (PROTONIX) 20 MG tablet Take 1 tablet (20 mg total) by mouth 2 (two) times daily. Patient not taking: Reported on 10/30/2021 04/18/20   Linwood Dibbles, MD      Allergies    Patient has no known allergies.    Review of Systems   Review of Systems  Constitutional:  Negative for fever.  HENT:  Negative for congestion and sore throat.   Eyes: Negative.   Respiratory:  Positive for cough and shortness of breath. Negative for chest tightness.   Cardiovascular:  Negative for chest pain and palpitations.  Gastrointestinal:  Positive for nausea and vomiting. Negative for abdominal pain.  Genitourinary: Negative.   Musculoskeletal:  Negative for arthralgias, joint swelling and neck pain.  Skin: Negative.  Negative for rash and wound.  Neurological:  Negative for dizziness, weakness, light-headedness, numbness and headaches.  Psychiatric/Behavioral: Negative.      Physical Exam Updated Vital Signs BP (!) 149/98 (BP Location: Right Arm)   Pulse 81   Temp 98.2 F (36.8 C)   Resp 16  Ht 5\' 9"  (1.753 m)   Wt 77.1 kg   SpO2 99%   BMI 25.10 kg/m  Physical Exam Vitals and nursing note reviewed.  Constitutional:      Appearance: She is well-developed.  HENT:     Head: Normocephalic and atraumatic.  Eyes:     Conjunctiva/sclera: Conjunctivae normal.  Cardiovascular:     Rate and Rhythm: Normal rate and regular rhythm.     Heart sounds: Normal heart sounds.  Pulmonary:     Effort: Pulmonary effort is normal.     Breath sounds: Rhonchi present.  No wheezing.     Comments: Rhonchi in bilateral lower lung fields, right greater than left.  No wheezing. Abdominal:     General: Bowel sounds are normal.     Palpations: Abdomen is soft.     Tenderness: There is no abdominal tenderness. There is no guarding.  Musculoskeletal:        General: Normal range of motion.     Cervical back: Normal range of motion.  Skin:    General: Skin is warm and dry.  Neurological:     General: No focal deficit present.     Mental Status: She is alert.     ED Results / Procedures / Treatments   Labs (all labs ordered are listed, but only abnormal results are displayed) Labs Reviewed  CBC WITH DIFFERENTIAL/PLATELET - Abnormal; Notable for the following components:      Result Value   Platelets 547 (*)    All other components within normal limits  URINALYSIS, ROUTINE W REFLEX MICROSCOPIC - Abnormal; Notable for the following components:   Ketones, ur 5 (*)    All other components within normal limits  COMPREHENSIVE METABOLIC PANEL  LIPASE, BLOOD    EKG None ED ECG REPORT   Date: 12/07/2022  Rate: 74  Rhythm: normal sinus rhythm  QRS Axis: normal  Intervals: normal  ST/T Wave abnormalities: normal  Conduction Disutrbances:none  Narrative Interpretation:   Old EKG Reviewed: unchanged  I have personally reviewed the EKG tracing and agree with the computerized printout as noted.   Radiology DG Chest 2 View  Result Date: 12/07/2022 CLINICAL DATA:  Shortness of breath.  Recent diagnosis of pneumonia EXAM: CHEST - 2 VIEW COMPARISON:  10/30/2021 FINDINGS: Right perihilar airspace disease suspicious for pneumonia. The heart is normal in size. No pulmonary edema, pleural effusion or pneumothorax. No acute osseous findings. IMPRESSION: Right perihilar airspace disease suspicious for pneumonia. Followup PA and lateral chest X-ray is recommended in 3-4 weeks following trial of antibiotic therapy to ensure resolution and exclude underlying  malignancy. Electronically Signed   By: Narda Rutherford M.D.   On: 12/07/2022 15:06    Procedures Procedures    Medications Ordered in ED Medications  albuterol (VENTOLIN HFA) 108 (90 Base) MCG/ACT inhaler 2 puff (has no administration in time range)  AeroChamber Plus Flo-Vu Medium MISC 1 each (has no administration in time range)  ipratropium-albuterol (DUONEB) 0.5-2.5 (3) MG/3ML nebulizer solution 3 mL (3 mLs Nebulization Given 12/07/22 1519)    ED Course/ Medical Decision Making/ A&P                                 Medical Decision Making Persistent cough, shortness of breath and nausea, she travels back and forth between [place] and [place] as her daughter lives in [place] and she helps with the grandchildren.  Therefore she receives much of her  medical care there, unfortunately she forgot her albuterol MDI in [place] when she left to come home here.  She does have her antibiotics and she has been compliant, she started Augmentin and doxycycline yesterday.  She has no hypoxia here, denies shortness of breath with exertion, she gets short winded when she has a cough spell.  She was given an albuterol neb treatment here which greatly improved her cough symptom.  She was given an MDI here for home use.  She was encouraged to also take the Zofran which was prescribed in [place] but she has been resistant to taking, she was encouraged to use this medication to help with hydration and to avoid vomiting.  Strict return precautions were outlined.  She states she has a follow-up appointment in [place] next week with the clinic and will be traveling there to be with her daughter.  Amount and/or Complexity of Data Reviewed Labs: ordered.    Details: Labs reviewed, WBC count normal at 8.5, lipase 25, LFTs are normal range, urinalysis is negative. Radiology: ordered.    Details: Chest x-ray reviewed and agree with interpretation, she has a right perihilar infiltrate.  She is been on her  antibiotics for less than 48 hours, would not expect significant improvement at this stage, patient was encouraged to complete the entire course of antibiotics.  Risk Prescription drug management.           Final Clinical Impression(s) / ED Diagnoses Final diagnoses:  Community acquired pneumonia of right lung, unspecified part of lung    Rx / DC Orders ED Discharge Orders     None         Victoriano Lain 12/07/22 1720    Kommor, Wyn Forster, MD 12/08/22 [phone]
